# Patient Record
Sex: Female | Born: 1970 | Race: Black or African American | Hispanic: No | Marital: Single | Smoking: Current every day smoker
Health system: Southern US, Community
[De-identification: ages and names within clinical notes are randomized; demographics above are authoritative.]

## PROBLEM LIST (undated history)

## (undated) DIAGNOSIS — Z86018 Personal history of other benign neoplasm: Secondary | ICD-10-CM

## (undated) DIAGNOSIS — M1990 Unspecified osteoarthritis, unspecified site: Secondary | ICD-10-CM

## (undated) DIAGNOSIS — D259 Leiomyoma of uterus, unspecified: Secondary | ICD-10-CM

## (undated) DIAGNOSIS — R05 Cough: Secondary | ICD-10-CM

## (undated) DIAGNOSIS — J302 Other seasonal allergic rhinitis: Secondary | ICD-10-CM

## (undated) DIAGNOSIS — N920 Excessive and frequent menstruation with regular cycle: Secondary | ICD-10-CM

## (undated) DIAGNOSIS — E559 Vitamin D deficiency, unspecified: Secondary | ICD-10-CM

## (undated) DIAGNOSIS — R058 Other specified cough: Secondary | ICD-10-CM

## (undated) DIAGNOSIS — N926 Irregular menstruation, unspecified: Secondary | ICD-10-CM

---

## 1998-07-07 HISTORY — PX: ORIF ORBITAL FRACTURE: SHX5312

## 2004-12-05 ENCOUNTER — Encounter (INDEPENDENT_AMBULATORY_CARE_PROVIDER_SITE_OTHER): Payer: Self-pay | Admitting: *Deleted

## 2004-12-05 ENCOUNTER — Inpatient Hospital Stay (HOSPITAL_COMMUNITY): Admission: RE | Admit: 2004-12-05 | Discharge: 2004-12-07 | Payer: Self-pay | Admitting: Obstetrics and Gynecology

## 2004-12-05 HISTORY — PX: OTHER SURGICAL HISTORY: SHX169

## 2005-06-12 ENCOUNTER — Other Ambulatory Visit: Admission: RE | Admit: 2005-06-12 | Discharge: 2005-06-12 | Payer: Self-pay | Admitting: Obstetrics and Gynecology

## 2008-04-17 ENCOUNTER — Inpatient Hospital Stay (HOSPITAL_COMMUNITY): Admission: AD | Admit: 2008-04-17 | Discharge: 2008-04-20 | Payer: Self-pay | Admitting: Obstetrics and Gynecology

## 2008-05-06 ENCOUNTER — Ambulatory Visit (HOSPITAL_COMMUNITY): Admission: RE | Admit: 2008-05-06 | Discharge: 2008-05-06 | Payer: Self-pay | Admitting: Obstetrics and Gynecology

## 2008-05-06 HISTORY — PX: OTHER SURGICAL HISTORY: SHX169

## 2009-01-07 ENCOUNTER — Emergency Department (HOSPITAL_BASED_OUTPATIENT_CLINIC_OR_DEPARTMENT_OTHER): Admission: EM | Admit: 2009-01-07 | Discharge: 2009-01-07 | Payer: Self-pay | Admitting: Emergency Medicine

## 2010-11-19 NOTE — Discharge Summary (Signed)
Kristi [name], Kristi [name]                ACCOUNT NO.:  [id number]   MEDICAL RECORD NO.:  [id number]          PATIENT TYPE:  INP   LOCATION:  9103                          [facility]ILITY:  [facility]   PHYSICIAN:  Janine Limbo, M.D.DATE OF BIRTH:  06-Jul-1971   DATE OF ADMISSION:  04/17/2008  DATE OF DISCHARGE:                               DISCHARGE SUMMARY   ADMITTING DIAGNOSES:  1. Intrauterine pregnancy at 37 weeks.  2. Nonreassuring fetal heart rate.   DISCHARGE DIAGNOSES:  1. Intrauterine pregnancy at 37 weeks.  2. Nonreassuring fetal heart rate.  3. Open area to the right, the incision with packing inserted.   PROCEDURES:  1. Primary low transverse cesarean section.  2. Wound packing.   HOSPITAL COURSE:  Kristi [name] is a 40 year old gravida 1, para 0 at 75  weeks who presented with spontaneous rupture of membranes on the morning  of April 17, 2008.  Her pregnancy had been remarkable for;  1. History of myomectomy for pedunculated fibroid in 2006, but no      myometrial penetration.  2. History of abnormal Pap.  3. History of STDs.  4. Previous smoker.  5. Group B strep positive.  6. Advanced maternal age.   On admission, leaking of fluid was verified with clear fluid.  Cervix  was a 0.5-1 cm, 80% with the vertex -3 to -4, there was 2+ pitting edema  in lower extremities.  The patient was contracting every 3-7 minutes at  regular quality.  Fetal heart rate was reassuring.  Pitocin was begun.  Epidural was placed later in the afternoon.  At 6:20 p.m., pitocin was  on 14, contractions were every 1-3 minutes.  Montevideos were 180-200.  There were some mild variable decels noted.  Cervix was 400%, vertex -1  with Floxin molding noted.  Amnioinfusion was begun by 10:30.  There was  a deep variable.  Cervix was 90%, vertex -1 with still more molding.  There was a prolonged decel noted during that examination lasting 4  minutes.  Pitocin was discontinued.  IV bolus and O2 were  initiated.  The patient was consented for C-section and the patient was taken to the  operating room where primary low transverse cesarean section was  performed by Dr. Estanislado Pandy under existing epidural anesthesia.  The  findings were a double nuchal cord.  Baby was a female, Kristi [name], born at  10:59 p.m.  Apgars were 9 and 9, weight was 6 pounds 15 ounces.  Infant  was taken to the full-term nursery.  Mother was taken to recovery in  good condition.  By the morning of April 18, 2008, the patient was  doing well.  Her physical exam was within normal limits.  Her hemoglobin  was 10.2, hematocrit 30.5, white blood cell count 12.6, and platelet  count was 247, 2-3+ pitting edema lower extremities.  She did have some  urine output diminishment.  She was given some IV hydration.  Orthostatics were normal.  Over time, urine output began to improve.  By  postop day #2, the patient was doing well.  She was working  on breast-  feeding.  Her physical exam was within normal limits.  Fundus was firm.  Lochia was scant.  Incision was clean, dry, and intact with Steri-Strips  and subcuticular sutures.  She was undecided regarding contraception  choices.  By postop day #3, the patient was doing well, but she wanted a  urinalysis for protein secondary to increased swelling and requested her  mom for UA.  She denied headache, visual symptoms, epigastric pain, and  despite her normal blood pressure, the patient has still requested the  UA.  I reviewed with her the possibility of having a protein in her  urine from lochia and the potential need for a cath UA is necessary.  The patient still wished to proceed with a clean-catch UA.  She was  still undecided regarding contraception.  She has also had some drainage  from her incision.  On examination, the incision was wet with a few  Steri-Strips left.  There was small amount of serosanguineous drainage.  No foul odor.  No obvious incisional separation, but was able  to extrude  a small amount of serosanguineous drainage from the incision with  pressure.  Dr. Stefano Gaul was consulted.  He saw the patient.  He did  explore the incision and found a small open area to the right without  any foul smell or signs of infection.  Incision was clean.  Fascia was  intact and was packed with 4x4 gauze.  He made a decision after  consulting with the patient's mom that she could do the dressing and  packing changes at home and the patient's mother and the patient were in  agreement with this plan.  She was deemed to receive full benefit of her  hospital stay.  Urinalysis was negative for protein and she was  discharged home.   DISCHARGE INSTRUCTIONS:  Per [facility] handout.  The patient  will also have twice daily packing changes done by her mom and then she  will call us with any increase in drainage, any fever, abdominal pain,  foul-smelling odor, or any other significant issues.  Discharge followup  will occur in 1 week at [facility] with Dr. Estanislado Pandy for  incisional check and then 6 weeks for postop C-section exam.  She also  will be seen at any time p.r.n.      Kristi [name], C.N.M.      Janine Limbo, M.D.  Electronically Signed    VLL/MEDQ  D:  04/20/2008  T:  04/21/2008  Job:  [id number]

## 2010-11-19 NOTE — Op Note (Signed)
Kristi [name], Kristi [name]                ACCOUNT NO.:  [id number]   MEDICAL RECORD NO.:  [id number]          PATIENT TYPE:  AMB   LOCATION:  SDC                           [facility]   PHYSICIAN:  Janine Limbo, M.D.DATE OF BIRTH:  07/14/1970   DATE OF PROCEDURE:  05/06/2008  DATE OF DISCHARGE:                               OPERATIVE REPORT   PREOPERATIVE DIAGNOSES:  1. Status post cesarean section on April 17, 2008.  2. Wound separation.  3. Obesity (weight is 204 pounds, height is 5 feet 3 inches).   POSTOPERATIVE DIAGNOSES:  1. Status post cesarean section on April 17, 2008.  2. Wound separation.  3. Obesity (weight is 204 pounds, height is 5 feet 3 inches).   PROCEDURE:  Closure of incision.   SURGEON:  Janine Limbo, MD   FIRST ASSISTANT:  None.   ANESTHETIC:  General.   DISPOSITION:  Ms. Linquist is a 40 year old female, para 1-0-0-1, who had a  cesarean section on April 17, 2008.  She was noted to have a wound  separation and her incision has been cleaned daily.  She has been given  antibiotics.  At this point, she has good granulation tissue and she  wishes to have secondary closure of the incision.  She understands the  indications for her procedure and she accepts the risks of, but not  limited to, anesthetic complications, bleeding, infection, and possible  damage to the surrounding organs.   FINDINGS:  The incision was approximately 6 cm opened at the skin and  there was a small area of subcutaneous tissue that was opened even  larger than the incision.  The incision was approximately 5 cm deep.  There was good granulation tissue noted throughout.  There was no signs  of infection.  There was no evidence of necrotic tissue.   PROCEDURE IN DETAIL:  The patient was taken to the operating room where  general anesthetic was given.  The patient's abdomen was prepped with  multiple layers of Betadine and then sterilely draped.  A 10 mL of 0.5%  Marcaine  with epinephrine were injected directly into the incision.  The  subcutaneous area was closed using interrupted sutures of 2-0 PDS.  The  skin was reapproximated using interrupted sutures of #1 PDS.  Hemostasis  was adequate.  The estimated blood loss was less than 5 mL.  The patient  tolerated her procedure well.  She was awakened from her general  anesthetic without difficulty and then taken to the recovery room in  stable condition.  There were no complications during the procedure.  Sponge, needle, and instrument counts were correct.  Wound  classification is 2.   FOLLOWUP INSTRUCTIONS:  The patient was given a prescription for Motrin  and she will take 800 mg every 8 hours as needed for pain.  She will  also take Percocet 5/325 one tablet or two tablets every 4 hours as  needed for severe pain.  She was given Zofran 4 mg every 4-6 hours as  needed for nausea.  She will return to the office in  1 week for followup  examination.      Janine Limbo, M.D.  Electronically Signed     AVS/MEDQ  D:  05/06/2008  T:  05/06/2008  Job:  [id number]
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
## 2010-11-19 NOTE — Op Note (Signed)
NAMEWENDELIN, READER                ACCOUNT NO.:  [id number]   MEDICAL RECORD NO.:  [id number]          PATIENT TYPE:  INP   LOCATION:  9103                          [facility]   PHYSICIAN:  Crist Fat. Rivard, M.D. DATE OF BIRTH:  01-Dec-1970   DATE OF PROCEDURE:  DATE OF DISCHARGE:                               OPERATIVE REPORT   PREOPERATIVE DIAGNOSIS:  Intrauterine pregnancy at 37 weeks with  nonreassuring fetal heart rate.   POSTOPERATIVE DIAGNOSIS:  Intrauterine pregnancy at 37 weeks with  nonreassuring fetal heart rate.   ANESTHESIA:  Epidural, Dr. Tacy Dura.   PROCEDURE:  Primary low transverse cesarean section.   SURGEON:  Crist Fat. Rivard, MD   ASSISTANT:  Elby Showers. Mayford Knife, CNM   ESTIMATED BLOOD LOSS:  600 mL.   PROCEDURE:  After being informed of the planned procedure with possible  complications including bleeding, infection, injury to bowel, bladder,  or ureters, informed consent is obtained.  The patient was rapidly taken  to OR #1 and placed in the dorsal decubitus position, pelvis tilted to  the left.  A scalp lead was connected to the monitor and shows a fetal  heart rate to be recovered now between 120 and 130.  The patient is then  prepped and draped in a sterile fashion and a Foley catheter is already  in her bladder.  Monitor is removed.  After assessing adequate level of  anesthesia, we infiltrate the previous Pfannenstiel incision from the  previous myomectomy using 20 mL of Marcaine 0.25.  We performed a  Pfannenstiel incision on this previous incision and bring it down  sharply to the fascia.  The fascia is incised in a low transverse  fashion.  Linea alba is dissected and peritoneum is entered bluntly.  An  Alexis retractor is inserted and visceral peritoneum is entered in a low  transverse fashion allowing [place] to safely retract bladder by developing a  bladder flap.  Myometrium is entered in a low transverse fashion first  with knife, then extended  bluntly.  Amniotic fluid is clear.  We  assessed the breath of a female infant in right occiput transverse.  At  10:59 p.m. mouth and nose were suctioned.  Two nuchal cords were  reduced.  Baby was delivered.  Cord was clamped with two Kelly clamps  and sectioned and the baby was given to Dr. Joana Reamer, neonatologist  present in the room.  The placenta was delivered spontaneously.  Cord  has three vessels and uterine revision is negative.   We performed closure of the myometrium in two layers, first with a  running lock suture of 0 Vicryl, then with a Lembert suture of 0 Vicryl  imbricating the first one.  Hemostasis was completed on peritoneal edges  using cauterization.  Both tubes and both ovaries were assessed and  normal.  Both paracolic gutters were cleaned and the pelvis was  profusely irrigated with warm saline to again confirm a satisfactory  hemostasis.   Under fascia hemostasis is completed with cautery and the fascia is  closed with two running sutures of one Vicryl  meeting midline.  Wound is  irrigated with warm saline and undermined to release some tension from  previous scar tissue.  Hemostasis is completed with cautery and the skin  is closed with a subcuticular suture of 3-0 Monocryl and Steri-Strips.   Instrument and sponge count is complete x2.  Estimated blood loss is 600  mL.  The procedure is very well tolerated by the patient who is taken to  recovery room in a well and stable condition.   Little boy named Clayburn Pert was born at 10:59 p.m., received an Apgar of nine  and 1 minute nine at 5 minutes and weighs 6 pounds 15 ounces.   SPECIMEN:  Placenta and cord sent to labor and delivery.      Crist Fat Rivard, M.D.  Electronically Signed     SAR/MEDQ  D:  04/17/2008  T:  04/18/2008  Job:  [id number]

## 2010-11-22 NOTE — H&P (Signed)
[name], [name] NO.:  [id number]   MEDICAL RECORD NO.:  [id number]          PATIENT TYPE:  INP   LOCATION:  NA                            FACILITY:  [facility]   PHYSICIAN:  Janine Limbo, M.D.DATE OF BIRTH:  [birth year]   DATE OF ADMISSION:  DATE OF DISCHARGE:                                HISTORY & PHYSICAL   HISTORY OF PRESENT ILLNESS:  Kristi [name] is a 40 year old female, gravida 0,  who presents with a pelvic mass. The patient has a known history of  fibroids. The patient had a MRI of the pelvis with and without contrast on  October 21, 2004. She was found to have a large retrouterine mass but the  appearance was indeterminate. There was a question of a fibroid.   An ultrasound showed a 7.7 cm x 3.7 cm uterus with an 8.3 x 6.9 cm pelvic  mass and again there was a question of a fibroid versus an ovarian cyst. She  complains of pelvic fullness. The patient does have a past history of  Chlamydia. Her most recent Pap smear was within normal limits.   ALLERGIES:  No known drug allergies.   PAST MEDICAL HISTORY:  1.  The patient has had her wisdom teeth removed.  2.  She also has had eye surgery.  3.  She reports having asthma as a child.  4.  She also reports a past history of migraine headaches.   SOCIAL HISTORY:  The patient smokes one pack of cigarettes every three days.  She drinks alcohol socially. She denies other recreational drug uses.   REVIEW OF SYMPTOMS:  Please see history of present illness.   FAMILY HISTORY:  The patient's father died from a pulmonary embolus. Her  mother has hypercholesterolemia. Her mother also has diabetes. She has a  brother with asthma.   PHYSICAL EXAMINATION:  VITAL SIGNS:  Weight is 173 pounds.  HEENT:  Within normal limits.  CHEST:  Clear.  HEART:  Regular rate and rhythm.  BREASTS:  Without masses.  ABDOMEN:  Nontender. There is a sensation of fullness in the pelvis.  EXTREMITIES:  Grossly normal.  NEUROLOGICAL:  Grossly normal.  PELVIC:  External genitalia is normal. The vagina is normal. Cervix is  nontender and no lesions. Uterus is 10 to 12 weeks size and irregular.  Adnexa has no masses. Rectovaginal exam confirmed.   ASSESSMENT:  1.  Pelvic mass.  2.  Fibroids.   PLAN:  The patient will undergo an exploratory laparotomy. We will remove  fibroids that are encountered. The patient understands the indications for  her surgery and she accepts the risks of but not limited to anesthetic  complications, bleeding, infections, and possible damage to the surrounding  organ.       AVS/MEDQ  D:  12/04/2004  T:  12/04/2004  Job:  [id number]

## 2010-11-22 NOTE — Discharge Summary (Signed)
Kristi [name], Kristi [name]                ACCOUNT NO.:  [id number]   MEDICAL RECORD NO.:  [id number]          PATIENT TYPE:  INP   LOCATION:  9315                          FACILITY:  [facility]   PHYSICIAN:  Janine Limbo, M.D.DATE OF BIRTH:  11-25-1970   DATE OF ADMISSION:  12/05/2004  DATE OF DISCHARGE:  12/07/2004                                 DISCHARGE SUMMARY   DISCHARGE DIAGNOSIS:  Uterine fibroid.   OPERATION:  On the date of admission the patient underwent an exploratory  laparotomy for a pelvic mass along with an abdominal myomectomy and  tolerated the procedures well.  The patient was found to have a pedunculated  fibroid which measured 9.6 x 6.8 x 6.2 cm and weighed 167 grams.   HISTORY OF PRESENT ILLNESS:  [name] is a 40 year old female with a known  history of uterine fibroids who is gravida 0, who presents for exploratory  laparotomy because of a pelvic mass.  Please see the patient's dictated  History and Physical Examination for details.   PREOPERATIVE PHYSICAL EXAMINATION:  Weight 173 pounds.  GENERAL:  Exam is within normal limits, however note on abdominal exam that  there is a sensation of fullness in the pelvic region.  PELVIC:  External genitalia is normal.  Vagina is normal.  Cervix is  nontender without lesions.  Uterus appears 10-12 week size and irregular.  Adnexa without any masses.  Rectovaginal exam confirms.   HOSPITAL COURSE:  On the date of admission the patient underwent  aforementioned procedures, tolerating them all well.  Postoperative course  was unremarkable with the patients postop hemoglobin being 11.6 (preop  hemoglobin 13.4).  By postop day #2 the patient had resumed bowel or bladder  function, and was therefore deemed ready for discharge home.   DISCHARGE MEDICATIONS:  1.  Phenergan 25 mg 1 tablet as needed every 6 hours for nausea.  2.  Ibuprofen 600 mg 1 tablet with food every 6 hours for three days then as      needed for pain.  3.   Ferrous sulfate 325 mg 1 tablet twice daily for six weeks.  4.  Darvocet-N 100 1-2 tablets every 4 hours as needed for pain.   FOLLOWUP:  The patient is scheduled for a six weeks postoperative visit with  Dr. Stefano Gaul on January 22, 2005 at 10:30 a.m.   DISCHARGE INSTRUCTIONS:  The patient was given a copy of [facility] postoperative instruction sheet.  She was further advised to avoid  driving for two weeks, heavy lifting for four weeks, and intercourse for six  weeks.   DISCHARGE DIET:  Without restriction.   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       EJP/MEDQ  D:  12/25/2004  T:  12/25/2004  Job:  [id number]

## 2010-11-22 NOTE — Op Note (Signed)
NAMERESHONDA, KOERBER                ACCOUNT NO.:  [id number]   MEDICAL RECORD NO.:  [id number]          PATIENT TYPE:  INP   LOCATION:  NA                            FACILITY:  [facility]   PHYSICIAN:  Janine Limbo, M.D.DATE OF BIRTH:  [birth year]   DATE OF PROCEDURE:  12/05/2004  DATE OF DISCHARGE:                                 OPERATIVE REPORT   PREOPERATIVE DIAGNOSES:  1.  Pelvic mass.  2.  Fibroid uterus.   POSTOPERATIVE DIAGNOSIS:  Fibroids.   PROCEDURES:  1.  Exploratory laparotomy.  2.  Abdominal myomectomy.   SURGEON:  Janine Limbo, M.D.   FIRST ASSISTANT:  [place]mira J. Adline Peals.   ANESTHETIC:  General.   DISPOSITION:  Ms. Merfeld is a 40 year old female, gravida 0, who presents  with a pelvic mass on ultrasound and on MRI.  She has a known history of  fibroids, but neither study could confirm that the mass was a fibroid  compared to an ovarian lesion.  The patient understands the indications for  her surgical procedure and she accepts the risk of, but not limited to,  anesthetic complications, bleeding, infections, and possible damage to the  surrounding organs.   FINDINGS:  The uterus was normal size.  There was an 8 x 6 cm pedunculated  fibroid from the left fundus.  There was also a 1 cm fibroid on the  posterior mid-upper uterus.  The fallopian tubes and the ovaries were  normal.  Because there was no penetration of the myometrium, I believe that  a vaginal delivery is still appropriate.   PROCEDURE:  The patient was taken to the operating room, where a general  anesthetic was given.  The patient's abdomen, perineum, and vagina were  prepped with multiple layers of Betadine.  A Foley catheter was placed in  the bladder.  The patient was then sterilely draped.  The lower abdomen was  injected with 20 mL of 0.5% Marcaine with epinephrine.  A low transverse  incision was made in the abdomen and carried sharply through the  subcutaneous tissue, fascia, and  the anterior peritoneum.  The pelvis was  explored with findings as mentioned above.  The uterus was elevated into the  operative field.  The base of each fibroid was injected with a total of 10  mL of 0.5% Marcaine with epinephrine.  The fibroid on the posterior upper miduterus was then removed.  Figure-of-  eight sutures of 3-0 Vicryl were used for hemostasis.  Hemostasis was  adequate.  The base of the pedunculated myoma was clamped and cut.  Vicryl 3-  0 was then used for hemostasis.  Interceed was placed over both lesions and  sutured into place.  Hemostasis was noted be adequate throughout.  The  pelvis was then irrigated.  All instruments were removed.  The fascia was closed using a running suture  of 0 Vicryl followed by three interrupted sutures of 0 Vicryl.  The  subcutaneous layer was closed using 3-0 Vicryl.  The skin was reapproximated  using a 4-0 suture of Monocryl.  Sponge, needle, and  instrument counts were  correct.  The estimated blood loss was  5 mL.  The patient tolerated her procedure well.  She was awakened from her  anesthetic without difficulty and taken to the recovery room in stable  condition.  She was given 30 mg of IV Toradol in the operating room.  The  patient was noted to drain clear yellow urine at the end of her procedure.       AVS/MEDQ  D:  12/05/2004  T:  12/05/2004  Job:  [id number]

## 2011-04-07 LAB — URINALYSIS, ROUTINE W REFLEX MICROSCOPIC
Bilirubin Urine: NEGATIVE
Glucose, UA: NEGATIVE
Nitrite: NEGATIVE
Protein, ur: NEGATIVE
Specific Gravity, Urine: 1.02
Urobilinogen, UA: 0.2
Urobilinogen, UA: 0.2

## 2011-04-07 LAB — CBC
HCT: 30.5 — ABNORMAL LOW
HCT: 36.8
HCT: 37.8
Hemoglobin: 10.2 — ABNORMAL LOW
Hemoglobin: 12.2
Hemoglobin: 12.3
MCHC: 33.5
MCV: 89.4
MCV: 89.7
Platelets: 299
Platelets: 621 — ABNORMAL HIGH
RBC: 3.37 — ABNORMAL LOW
RBC: 4.11
RDW: 14.2
RDW: 15.5
WBC: 7.9

## 2011-04-07 LAB — URINE MICROSCOPIC-ADD ON

## 2011-04-07 LAB — ELECTROLYTE PANEL: CO2: 27

## 2015-06-16 ENCOUNTER — Emergency Department (HOSPITAL_BASED_OUTPATIENT_CLINIC_OR_DEPARTMENT_OTHER)
Admission: EM | Admit: 2015-06-16 | Discharge: 2015-06-16 | Disposition: A | Discharge status: Home / Self Care | Payer: 59 | Attending: Emergency Medicine | Admitting: Emergency Medicine

## 2015-06-16 ENCOUNTER — Encounter (HOSPITAL_BASED_OUTPATIENT_CLINIC_OR_DEPARTMENT_OTHER): Payer: Self-pay | Admitting: *Deleted

## 2015-06-16 ENCOUNTER — Emergency Department (HOSPITAL_BASED_OUTPATIENT_CLINIC_OR_DEPARTMENT_OTHER): Payer: 59

## 2015-06-16 DIAGNOSIS — M545 Low back pain: Secondary | ICD-10-CM | POA: Diagnosis present

## 2015-06-16 DIAGNOSIS — F17200 Nicotine dependence, unspecified, uncomplicated: Secondary | ICD-10-CM | POA: Diagnosis not present

## 2015-06-16 DIAGNOSIS — Z3202 Encounter for pregnancy test, result negative: Secondary | ICD-10-CM | POA: Diagnosis not present

## 2015-06-16 DIAGNOSIS — R200 Anesthesia of skin: Secondary | ICD-10-CM | POA: Diagnosis not present

## 2015-06-16 DIAGNOSIS — M546 Pain in thoracic spine: Secondary | ICD-10-CM | POA: Diagnosis not present

## 2015-06-16 LAB — URINALYSIS, ROUTINE W REFLEX MICROSCOPIC
Bilirubin Urine: NEGATIVE
GLUCOSE, UA: NEGATIVE mg/dL
Hgb urine dipstick: NEGATIVE
KETONES UR: NEGATIVE mg/dL
LEUKOCYTES UA: NEGATIVE
NITRITE: NEGATIVE
PH: 6 (ref 5.0–8.0)
PROTEIN: NEGATIVE mg/dL
Specific Gravity, Urine: 1.013 (ref 1.005–1.030)

## 2015-06-16 LAB — PREGNANCY, URINE: Preg Test, Ur: NEGATIVE

## 2015-06-16 NOTE — ED Provider Notes (Signed)
CSN: [id number]     Arrival date & time 06/16/15  1044 History   First MD Initiated Contact with Patient 06/16/15 1141     Chief Complaint  Patient presents with  . Back Pain     (Consider location/radiation/quality/duration/timing/severity/associated sxs/prior Treatment) HPI  44 year old female presents with intermittent left-sided back pain that has been ongoing for the past 2 weeks. Mostly occurs whenever she lays down on her left side. Became concerned because over the last couple days she has had intermittent numbness like her arm is asleep over her left shoulder/trapezius. Has also occasionally been having numbness to her index finger, middle finger, and ring fingertips that comes and goes. Lasts about 5 minutes at a time. Denies weakness in her upper extremity or lower extremity. No neck pain. The pain is intermittent left back. No trauma. Currently while she is sitting up talking to me she denies any pain at all and has no weakness or numbness. No fevers. No known history of cancer. No bowel/bladder incontinence or saddle anesthesia. Symptoms do not change with neck range of motion.  History reviewed. No pertinent past medical history. Past Surgical History  Procedure Laterality Date  . Cesarean section     No family history on file. Social History  Substance Use Topics  . Smoking status: Current Every Day Smoker  . Smokeless tobacco: None  . Alcohol Use: Yes   OB History    No data available     Review of Systems  Constitutional: Negative for fever.  Gastrointestinal: Negative for vomiting and abdominal pain.  Genitourinary: Negative for dysuria and hematuria.  Musculoskeletal: Positive for back pain. Negative for neck pain.  Neurological: Positive for numbness. Negative for weakness and headaches.  All other systems reviewed and are negative.     Allergies  Review of patient's allergies indicates no known allergies.  Home Medications   Prior to Admission  medications   Not on File   BP 126/75 mmHg  Pulse 52  Temp(Src) 97.5 F (36.4 C) (Oral)  Resp 18  Ht 5\' 3"  (1.6 m)  Wt 175 lb (79.379 kg)  BMI 31.01 kg/m2  SpO2 100% Physical Exam  Constitutional: She is oriented to person, place, and time. She appears well-developed and well-nourished.  HENT:  Head: Normocephalic and atraumatic.  Right Ear: External ear normal.  Left Ear: External ear normal.  Nose: Nose normal.  Eyes: Right eye exhibits no discharge. Left eye exhibits no discharge.  Neck: Normal range of motion. Neck supple. No spinous process tenderness and no muscular tenderness present.  Cardiovascular: Normal rate, regular rhythm and normal heart sounds.   Pulmonary/Chest: Effort normal and breath sounds normal.  Abdominal: Soft. She exhibits no distension. There is no tenderness.  Musculoskeletal:       Cervical back: She exhibits no tenderness.       Thoracic back: She exhibits tenderness.       Lumbar back: She exhibits no tenderness.       Back:  Neurological: She is alert and oriented to person, place, and time.  Reflex Scores:      Bicep reflexes are 2+ on the right side and 2+ on the left side.      Patellar reflexes are 2+ on the right side and 2+ on the left side. 5/5 strength in all 4 extremities. Grossly normal sensation  Skin: Skin is warm and dry.  Nursing note and vitals reviewed.   ED Course  Procedures (including critical care time) Labs Review Labs  Reviewed  URINALYSIS, ROUTINE W REFLEX MICROSCOPIC (NOT AT [facility])  PREGNANCY, URINE    Imaging Review Dg Thoracic Spine 2 View  06/16/2015  CLINICAL DATA:  Dorsalgia EXAM: THORACIC SPINE 3 VIEWS COMPARISON:  None. FINDINGS: Frontal, lateral, and swimmer's views were obtained. There is no fracture or spondylolisthesis. There is slight disc narrowing at several levels in the mid thoracic region. No erosive change. No paraspinous lesions are identified. 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 No fracture or spondylolisthesis. Electronically Signed   By: Lowella Grip III M.D.   On: 06/16/2015 12:42   I have personally reviewed and evaluated these images and lab results as part of my medical decision-making.   EKG Interpretation None      MDM   Final diagnoses:  Left-sided thoracic back pain    Patient with what is likely muscular left-sided thoracic pain. Does not appear to be a renal issue and her urinalysis is negative. History is not consistent with pyelonephritis or kidney stone. Seems to be more pain only when touching it such as laying down or palpation. I do not understand why she would also have left arm symptoms. Very nonspecific with only fingertip numbness or trapezius numbness. Her strength and sensation is grossly intact now. At this point I feel she is stable for discharge but she will need to follow-up with a PCP for further outpatient care. Will refer to sports medicine for continuing pain.    Sherwood Gambler, MD 06/16/15 1537

## 2015-06-16 NOTE — ED Notes (Signed)
Patient c/o L side back pain that comes and goes for the past two weeks, pain sometimes shoots down L arm. Hurts to lie down on left side

## 2015-06-17 ENCOUNTER — Emergency Department (HOSPITAL_BASED_OUTPATIENT_CLINIC_OR_DEPARTMENT_OTHER): Payer: 59

## 2015-06-17 ENCOUNTER — Emergency Department (HOSPITAL_BASED_OUTPATIENT_CLINIC_OR_DEPARTMENT_OTHER)
Admission: EM | Admit: 2015-06-17 | Discharge: 2015-06-17 | Disposition: A | Discharge status: Home / Self Care | Payer: 59 | Attending: Emergency Medicine | Admitting: Emergency Medicine

## 2015-06-17 ENCOUNTER — Encounter (HOSPITAL_BASED_OUTPATIENT_CLINIC_OR_DEPARTMENT_OTHER): Payer: Self-pay | Admitting: *Deleted

## 2015-06-17 DIAGNOSIS — F17200 Nicotine dependence, unspecified, uncomplicated: Secondary | ICD-10-CM | POA: Diagnosis not present

## 2015-06-17 DIAGNOSIS — M25512 Pain in left shoulder: Secondary | ICD-10-CM | POA: Insufficient documentation

## 2015-06-17 DIAGNOSIS — M546 Pain in thoracic spine: Secondary | ICD-10-CM | POA: Insufficient documentation

## 2015-06-17 DIAGNOSIS — R200 Anesthesia of skin: Secondary | ICD-10-CM | POA: Diagnosis not present

## 2015-06-17 DIAGNOSIS — M545 Low back pain: Secondary | ICD-10-CM | POA: Diagnosis present

## 2015-06-17 LAB — CBC WITH DIFFERENTIAL/PLATELET
Basophils Absolute: 0 10*3/uL (ref 0.0–0.1)
Basophils Relative: 0 %
EOS PCT: 2 %
Eosinophils Absolute: 0.2 10*3/uL (ref 0.0–0.7)
HCT: 42.6 % (ref 36.0–46.0)
Hemoglobin: 14.1 g/dL (ref 12.0–15.0)
LYMPHS ABS: 4.7 10*3/uL — AB (ref 0.7–4.0)
LYMPHS PCT: 53 %
MCH: 31.1 pg (ref 26.0–34.0)
MCHC: 33.1 g/dL (ref 30.0–36.0)
MCV: 93.8 fL (ref 78.0–100.0)
MONO ABS: 0.7 10*3/uL (ref 0.1–1.0)
Monocytes Relative: 7 %
Neutro Abs: 3.4 10*3/uL (ref 1.7–7.7)
Neutrophils Relative %: 38 %
PLATELETS: 293 10*3/uL (ref 150–400)
RBC: 4.54 MIL/uL (ref 3.87–5.11)
RDW: 13.2 % (ref 11.5–15.5)
WBC: 9 10*3/uL (ref 4.0–10.5)

## 2015-06-17 LAB — BASIC METABOLIC PANEL
ANION GAP: 8 (ref 5–15)
BUN: 12 mg/dL (ref 6–20)
CHLORIDE: 106 mmol/L (ref 101–111)
CO2: 25 mmol/L (ref 22–32)
Calcium: 9.4 mg/dL (ref 8.9–10.3)
Creatinine, Ser: 1.02 mg/dL — ABNORMAL HIGH (ref 0.44–1.00)
Glucose, Bld: 93 mg/dL (ref 65–99)
POTASSIUM: 4.1 mmol/L (ref 3.5–5.1)
Sodium: 139 mmol/L (ref 135–145)

## 2015-06-17 LAB — D-DIMER, QUANTITATIVE (NOT AT ARMC)

## 2015-06-17 LAB — TROPONIN I

## 2015-06-17 MED ORDER — TRAMADOL HCL 50 MG PO TABS
50.0000 mg | ORAL_TABLET | Freq: Four times a day (QID) | ORAL | Status: DC | PRN
Start: 2015-06-17 — End: 2016-11-11

## 2015-06-17 MED ORDER — KETOROLAC TROMETHAMINE 60 MG/2ML IM SOLN: 60.0000 mg | Freq: Once | INTRAMUSCULAR | Status: DC

## 2015-06-17 MED ORDER — CYCLOBENZAPRINE HCL 10 MG PO TABS: 10.0000 mg | ORAL_TABLET | Freq: Two times a day (BID) | ORAL | Status: DC | PRN

## 2015-06-17 NOTE — ED Notes (Signed)
MD at bedside. 

## 2015-06-17 NOTE — ED Provider Notes (Signed)
CSN: [id number]     Arrival date & time 06/17/15  1550 History  By signing my name below, I, Hilda Lias, attest that this documentation has been prepared under the direction and in the presence of Dorie Rank, MD. Electronically Signed: Hilda Lias, ED Scribe. 06/17/2015. 4:22 PM.    Chief Complaint  Patient presents with  . Back Pain      The history is provided by the patient. No language interpreter was used.    HPI Comments: Kristi [name] is a 44 y.o. female who presents to the Emergency Department complaining of intermittent, worsening left-sided mid-back pain underneath the left shoulder blade with associated numbness and tingling in left hand and fingers that has been present for about a week and a half. Pt reports a week and a half ago that she woke up with a sharp pain in the same area that worsened with movement, and reports it was constant for a few days, and then went away for a few days. Pt reports a similar pain came back about 5 days ago for a few days, and then went away until it returned yesterday and has been constant since then. Pt was seen in ED yesterday for the same symptoms, and reports coming back today due to concern of her symptoms being related to a cardiac event. Pt now reports bending over, taking deep breaths, and other movements of the trunk make her pain worse. Pt denies doing anything to treat her pain prior to coming to ED today.   History reviewed. No pertinent past medical history. Past Surgical History  Procedure Laterality Date  . Cesarean section     History reviewed. No pertinent family history. Social History  Substance Use Topics  . Smoking status: Current Every Day Smoker  . Smokeless tobacco: None  . Alcohol Use: Yes   OB History    No data available     Review of Systems  Musculoskeletal: Positive for myalgias and back pain.  Neurological: Positive for numbness.    A complete 10 system review of systems was obtained and all systems  are negative except as noted in the HPI and PMH.    Allergies  Review of patient's allergies indicates no known allergies.  Home Medications   Prior to Admission medications   Medication Sig Start Date End Date Taking? Authorizing Provider  cyclobenzaprine (FLEXERIL) 10 MG tablet Take 1 tablet (10 mg total) by mouth 2 (two) times daily as needed for muscle spasms. 06/17/15   Dorie Rank, MD  traMADol (ULTRAM) 50 MG tablet Take 1 tablet (50 mg total) by mouth every 6 (six) hours as needed. 06/17/15   Dorie Rank, MD   BP 114/63 mmHg  Pulse 52  Temp(Src) 98.2 F (36.8 C) (Oral)  Resp 16  Ht 5\' 3"  (1.6 m)  Wt 79.379 kg  BMI 31.01 kg/m2  SpO2 100% Physical Exam  Constitutional: She appears well-developed and well-nourished. No distress.  HENT:  Head: Normocephalic and atraumatic.  Right Ear: External ear normal.  Left Ear: External ear normal.  Eyes: Conjunctivae are normal. Right eye exhibits no discharge. Left eye exhibits no discharge. No scleral icterus.  Neck: Neck supple. No tracheal deviation present.  Cardiovascular: Normal rate, regular rhythm and intact distal pulses.   Pulmonary/Chest: Effort normal and breath sounds normal. No stridor. No respiratory distress. She has no wheezes. She has no rales.  Abdominal: Soft. Bowel sounds are normal. She exhibits no distension. There is no tenderness. There is no rebound  and no guarding.  Musculoskeletal: She exhibits tenderness. She exhibits no edema.  Tenderness to palpation peri-scapular region on the left side  Neurological: She is alert. She has normal strength. No cranial nerve deficit (no facial droop, extraocular movements intact, no slurred speech) or sensory deficit. She exhibits normal muscle tone. She displays no seizure activity. Coordination normal.  Skin: Skin is warm and dry. No rash noted.  Psychiatric: She has a normal mood and affect.  Nursing note and vitals reviewed.   ED Course  Procedures (including critical  care time)  DIAGNOSTIC STUDIES: Oxygen Saturation is 100% on room air, normal by my interpretation.    COORDINATION OF CARE: 4:06 PM Discussed treatment plan with pt at bedside and pt agreed to plan.   Labs Review Labs Reviewed  CBC WITH DIFFERENTIAL/PLATELET - Abnormal; Notable for the following:    Lymphs Abs 4.7 (*)    All other components within normal limits  BASIC METABOLIC PANEL - Abnormal; Notable for the following:    Creatinine, Ser 1.02 (*)    All other components within normal limits  D-DIMER, QUANTITATIVE (NOT AT [facility])  TROPONIN I    Imaging Review Dg Chest 2 View  06/17/2015  CLINICAL DATA:  Left-sided chest pain intermittent over the last 2 weeks EXAM: CHEST - 2 VIEW COMPARISON:  None. FINDINGS: The heart size and mediastinal contours are within normal limits. Both lungs are clear. The visualized skeletal structures are unremarkable. IMPRESSION: No active disease. Electronically Signed   By: Inez Catalina M.D.   On: 06/17/2015 17:15   Dg Thoracic Spine 2 View  06/16/2015  CLINICAL DATA:  Dorsalgia EXAM: THORACIC SPINE 3 VIEWS COMPARISON:  None. FINDINGS: Frontal, lateral, and swimmer's views were obtained. There is no fracture or spondylolisthesis. There is slight disc narrowing at several levels in the mid thoracic region. No erosive change. No paraspinous lesions are identified. IMPRESSION: Minimal osteoarthritic change at several levels. No fracture or spondylolisthesis. Electronically Signed   By: Lowella Grip III M.D.   On: 06/16/2015 12:42   I have personally reviewed and evaluated these images and lab results as part of my medical decision-making.   EKG Interpretation   Date/Time:  Sunday June 17 2015 16:31:05 EST Ventricular Rate:  60 PR Interval:  134 QRS Duration: 87 QT Interval:  417 QTC Calculation: 417 R Axis:   62 Text Interpretation:  Sinus rhythm No old tracing to compare Confirmed by  Sanaai Doane  MD-J, Huxley Shurley [id number]) on 06/17/2015 4:48:05  PM      MDM   Final diagnoses:  Left-sided thoracic back pain    I suspect her symptoms are musculoskeletal in nature. The component of nerve impingement considering the paresthesias she is noted in her hand the other day.  She does have tenderness to palpation in the periscapular region.  The x-ray does not show pneumonia. Her laboratory tests are reassuring and I doubt pulmonary embolism or acute coronary syndrome.  Dc home with symptomatic treatment.  Follow up with sports medicine as previously recommended I personally performed the services described in this documentation, which was scribed in my presence.  The recorded information has been reviewed and is accurate.   Dorie Rank, MD 06/17/15 [phone]

## 2015-06-17 NOTE — ED Notes (Signed)
Continues to have back pain.  Treated in ED yesterday for same.

## 2015-06-17 NOTE — Discharge Instructions (Signed)

## 2015-06-17 NOTE — ED Notes (Signed)
DC instructions reviewed with both pt and family member, discussed importance of safety while taking PO pain med and muscle relaxants. Also discussed pain and control and the times to return to the ED. Opportunity for questions provided. Teach Back Method utilized

## 2015-07-06 ENCOUNTER — Encounter: Payer: Self-pay | Admitting: Family Medicine

## 2015-07-06 ENCOUNTER — Ambulatory Visit (INDEPENDENT_AMBULATORY_CARE_PROVIDER_SITE_OTHER): Payer: 59 | Admitting: Family Medicine

## 2015-07-06 VITALS — BP 108/71 | HR 53 | Ht 63.0 in | Wt 182.0 lb

## 2015-07-06 DIAGNOSIS — M546 Pain in thoracic spine: Secondary | ICD-10-CM | POA: Diagnosis not present

## 2015-07-06 DIAGNOSIS — M549 Dorsalgia, unspecified: Secondary | ICD-10-CM

## 2015-07-06 NOTE — Patient Instructions (Signed)
Your exam is reassuring that this is muscular. Take aleve 2 tabs twice a day OR ibuprofen 600mg  three times a day with food ONLY if needed for pain and inflammation. Do home exercises shown in the handout most days of the week for next 6 weeks. Discuss possible cardiac stress test with your family physician. Call me if you need anything or want to do physical therapy. Otherwise follow up with me as needed.

## 2015-07-10 DIAGNOSIS — M549 Dorsalgia, unspecified: Secondary | ICD-10-CM | POA: Insufficient documentation

## 2015-07-10 NOTE — Progress Notes (Signed)
PCP: No primary care provider on file.  Subjective:   HPI: Patient is a 45 y.o. female here for back pain.  Patient reports she's had mid back pain since last month. No acute injury or trauma. Pain left side of back and rib cage. Associated tingling into left arm and 3rd, 4th digits. Had radiographs thoracic spine and chest, EKG that were reassuring. Pain level 0/10 now - mild ache. Tried muscle relaxant. No bowel/bladder dysfunction.  No past medical history on file.  Current Outpatient Prescriptions on File Prior to Visit  Medication Sig Dispense Refill  . cyclobenzaprine (FLEXERIL) 10 MG tablet Take 1 tablet (10 mg total) by mouth 2 (two) times daily as needed for muscle spasms. 20 tablet 0  . traMADol (ULTRAM) 50 MG tablet Take 1 tablet (50 mg total) by mouth every 6 (six) hours as needed. 15 tablet 0   No current facility-administered medications on file prior to visit.    Past Surgical History  Procedure Laterality Date  . Cesarean section      Allergies  Allergen Reactions  . Latex Hives    Social History   Social History  . Marital Status: Single    Spouse Name: N/A  . Number of Children: N/A  . Years of Education: N/A   Occupational History  . Not on file.   Social History Main Topics  . Smoking status: Current Every Day Smoker -- 0.50 packs/day    Types: Cigarettes  . Smokeless tobacco: Not on file  . Alcohol Use: 0.0 oz/week    0 Standard drinks or equivalent per week  . Drug Use: No  . Sexual Activity: Not on file   Other Topics Concern  . Not on file   Social History Narrative    No family history on file.  BP 108/71 mmHg  Pulse 53  Ht 5\' 3"  (1.6 m)  Wt 182 lb (82.555 kg)  BMI 32.25 kg/m2  Review of Systems: See HPI above.    Objective:  Physical Exam:  Gen: NAD  Neck/Upper back: No gross deformity, swelling, bruising. TTP left thoracic paraspinal region, lateral ribcage.  No midline/bony TTP. FROM neck without pain . BUE  strength 5/5.   Sensation intact to light touch.   2+ equal reflexes in triceps, biceps, brachioradialis tendons. Negative spurlings. NV intact distal BUEs.    Assessment & Plan:  1. Upper back pain - consistent with muscle strain.  Reassured.  She plans to discuss cardiac stress test with PCP as well given unusual nature of pain and radiation into left arm - I think this is reasonable.  Only risk factor is cigarette smoking.  Shown home exercises to do daily.  Call us if she would like to do physical therapy.  F/u prn otherwise.

## 2015-07-10 NOTE — Assessment & Plan Note (Signed)
consistent with muscle strain.  Reassured.  She plans to discuss cardiac stress test with PCP as well given unusual nature of pain and radiation into left arm - I think this is reasonable.  Only risk factor is cigarette smoking.  Shown home exercises to do daily.  Call us if she would like to do physical therapy.  F/u prn otherwise.

## 2016-11-06 ENCOUNTER — Other Ambulatory Visit: Payer: Self-pay | Admitting: Obstetrics and Gynecology

## 2016-11-11 ENCOUNTER — Other Ambulatory Visit: Payer: Self-pay | Admitting: Obstetrics and Gynecology

## 2016-11-11 ENCOUNTER — Encounter (HOSPITAL_BASED_OUTPATIENT_CLINIC_OR_DEPARTMENT_OTHER): Payer: Self-pay | Admitting: *Deleted

## 2016-11-11 NOTE — Progress Notes (Signed)
NPO AFTER MN.  ARRIVE AT 0600.  NEEDS URINE PREG.  GETTING CBC DONE Monday 11-17-2016.

## 2016-11-17 DIAGNOSIS — D259 Leiomyoma of uterus, unspecified: Secondary | ICD-10-CM | POA: Diagnosis not present

## 2016-11-17 DIAGNOSIS — N72 Inflammatory disease of cervix uteri: Secondary | ICD-10-CM | POA: Diagnosis not present

## 2016-11-17 DIAGNOSIS — Z975 Presence of (intrauterine) contraceptive device: Secondary | ICD-10-CM | POA: Diagnosis not present

## 2016-11-17 DIAGNOSIS — F17210 Nicotine dependence, cigarettes, uncomplicated: Secondary | ICD-10-CM | POA: Diagnosis not present

## 2016-11-17 DIAGNOSIS — N841 Polyp of cervix uteri: Secondary | ICD-10-CM | POA: Diagnosis not present

## 2016-11-17 DIAGNOSIS — N858 Other specified noninflammatory disorders of uterus: Secondary | ICD-10-CM | POA: Diagnosis not present

## 2016-11-17 DIAGNOSIS — Z6833 Body mass index (BMI) 33.0-33.9, adult: Secondary | ICD-10-CM | POA: Diagnosis not present

## 2016-11-17 DIAGNOSIS — E669 Obesity, unspecified: Secondary | ICD-10-CM | POA: Diagnosis not present

## 2016-11-17 DIAGNOSIS — N920 Excessive and frequent menstruation with regular cycle: Secondary | ICD-10-CM | POA: Diagnosis present

## 2016-11-17 LAB — CBC
HCT: 38.6 % (ref 36.0–46.0)
Hemoglobin: 12.5 g/dL (ref 12.0–15.0)
MCH: 30.5 pg (ref 26.0–34.0)
MCHC: 32.4 g/dL (ref 30.0–36.0)
MCV: 94.1 fL (ref 78.0–100.0)
PLATELETS: 268 10*3/uL (ref 150–400)
RBC: 4.1 MIL/uL (ref 3.87–5.11)
RDW: 14 % (ref 11.5–15.5)
WBC: 6.2 10*3/uL (ref 4.0–10.5)

## 2016-11-17 NOTE — H&P (Signed)
Admission History and Physical Exam for a Gynecology Patient  Kristi [name] is a 46 y.o. female, who presents for a laparoscopy assisted vaginal and salpingectomy. She has been followed at the [facility] and [facility] division of [facility]. He complains of heavy bleeding.  She had a Mirena IUD placed in May 2015.  Her bleeding was controlled initially.  Now,her bleeding has become heavy again. An ultrasound showed 2 fibroids.  She has had a myomectomy in the past as well as a cesarean section. Her endometrial biopsy is normal. Her Pap is normal. GC and CHL are negative.  OB History    No data available      Past Medical History:  Diagnosis Date  . Arthritis    back  . Dry cough   . History of uterine fibroid   . Irregular periods   . Menorrhagia   . Seasonal allergies   . Uterine fibroid   . Vitamin D deficiency     No prescriptions prior to admission.    Past Surgical History:  Procedure Laterality Date  . CESAREAN SECTION  04/17/2008  . CLOSURE INCISION FOR POST OP WOUND SEPERATION  05/06/2008  . EXPLORATORY LAPAROTOMY ABDOMINAL MYOMECOTMY  12/05/2004  . ORIF ORBITAL FRACTURE  2000   retained hardware    Allergies  Allergen Reactions  . Latex Rash and Other (See Comments)    "burning and irriation"    Family History: family history is not on file.  Social History:  reports that she has been smoking Cigarettes.  She has a 10.50 pack-year smoking history. She has never used smokeless tobacco. She reports that she drinks alcohol. She reports that she does not use drugs.  Review of systems: See HPI.  Admission Physical Exam:    Body mass index is 33.3 kg/m.  Height 5\' 3"  (1.6 m), weight 188 lb (85.3 kg), last menstrual period 11/01/2016.  HEENT:                 Within normal limits Chest:                   Clear Heart:                    Regular rate and rhythm Breasts:                No masses, skin changes, bleeding,  or discharge present Abdomen:             Nontender, no masses Extremities:          Grossly normal Neurologic exam: Grossly normal  Pelvic exam:  External genitalia: normal general appearance Vaginal: normal without tenderness, induration or masses Cervix: normal appearance Adnexa: normal bimanual exam Uterus: NSSC  Assessment:  Menorrhagia  Fibroid uterus  Prior myomectomy  Plan:  The patient will undergo a assisted vaginal hysterectomy and bilateral salpingectomy .  Eli Hose 11/17/2016

## 2016-11-18 ENCOUNTER — Ambulatory Visit (HOSPITAL_BASED_OUTPATIENT_CLINIC_OR_DEPARTMENT_OTHER): Payer: 59 | Admitting: Anesthesiology

## 2016-11-18 ENCOUNTER — Encounter (HOSPITAL_COMMUNITY)
Admission: RE | Disposition: A | Discharge status: Home / Self Care | Payer: Self-pay | Source: Ambulatory Visit | Attending: Obstetrics and Gynecology

## 2016-11-18 ENCOUNTER — Encounter (HOSPITAL_BASED_OUTPATIENT_CLINIC_OR_DEPARTMENT_OTHER): Payer: Self-pay | Admitting: *Deleted

## 2016-11-18 ENCOUNTER — Ambulatory Visit (HOSPITAL_BASED_OUTPATIENT_CLINIC_OR_DEPARTMENT_OTHER)
Admission: RE | Admit: 2016-11-18 | Discharge: 2016-11-19 | Disposition: A | Discharge status: Home / Self Care | Payer: 59 | Source: Ambulatory Visit | Attending: Obstetrics and Gynecology | Admitting: Obstetrics and Gynecology

## 2016-11-18 DIAGNOSIS — D259 Leiomyoma of uterus, unspecified: Secondary | ICD-10-CM | POA: Insufficient documentation

## 2016-11-18 DIAGNOSIS — Z6833 Body mass index (BMI) 33.0-33.9, adult: Secondary | ICD-10-CM | POA: Insufficient documentation

## 2016-11-18 DIAGNOSIS — N858 Other specified noninflammatory disorders of uterus: Secondary | ICD-10-CM | POA: Insufficient documentation

## 2016-11-18 DIAGNOSIS — Z975 Presence of (intrauterine) contraceptive device: Secondary | ICD-10-CM | POA: Insufficient documentation

## 2016-11-18 DIAGNOSIS — N72 Inflammatory disease of cervix uteri: Secondary | ICD-10-CM | POA: Insufficient documentation

## 2016-11-18 DIAGNOSIS — E669 Obesity, unspecified: Secondary | ICD-10-CM | POA: Insufficient documentation

## 2016-11-18 DIAGNOSIS — F17210 Nicotine dependence, cigarettes, uncomplicated: Secondary | ICD-10-CM | POA: Insufficient documentation

## 2016-11-18 DIAGNOSIS — N841 Polyp of cervix uteri: Secondary | ICD-10-CM | POA: Insufficient documentation

## 2016-11-18 DIAGNOSIS — N920 Excessive and frequent menstruation with regular cycle: Secondary | ICD-10-CM | POA: Diagnosis not present

## 2016-11-18 HISTORY — DX: Cough: R05

## 2016-11-18 HISTORY — DX: Other specified cough: R05.8

## 2016-11-18 HISTORY — DX: Leiomyoma of uterus, unspecified: D25.9

## 2016-11-18 HISTORY — DX: Personal history of other benign neoplasm: Z86.018

## 2016-11-18 HISTORY — PX: LAPAROSCOPIC ASSISTED VAGINAL HYSTERECTOMY: SHX5398

## 2016-11-18 HISTORY — DX: Other seasonal allergic rhinitis: J30.2

## 2016-11-18 HISTORY — DX: Vitamin D deficiency, unspecified: E55.9

## 2016-11-18 HISTORY — DX: Irregular menstruation, unspecified: N92.6

## 2016-11-18 HISTORY — DX: Unspecified osteoarthritis, unspecified site: M19.90

## 2016-11-18 HISTORY — PX: BILATERAL SALPINGECTOMY: SHX5743

## 2016-11-18 HISTORY — DX: Excessive and frequent menstruation with regular cycle: N92.0

## 2016-11-18 LAB — CREATININE, SERUM: CREATININE: 0.99 mg/dL (ref 0.44–1.00)

## 2016-11-18 LAB — POCT PREGNANCY, URINE: Preg Test, Ur: NEGATIVE

## 2016-11-18 SURGERY — HYSTERECTOMY, VAGINAL, LAPAROSCOPY-ASSISTED
Anesthesia: General | Site: Abdomen

## 2016-11-18 MED ORDER — HEPARIN SOD (PORK) LOCK FLUSH 100 UNIT/ML IV SOLN
INTRAVENOUS | Status: AC
  Filled 2016-11-18: qty 5

## 2016-11-18 MED ORDER — IBUPROFEN 800 MG PO TABS
800.0000 mg | ORAL_TABLET | Freq: Three times a day (TID) | ORAL | Status: DC | PRN
  Administered 2016-11-18: 800 mg via ORAL
  Filled 2016-11-18: qty 1

## 2016-11-18 MED ORDER — ONDANSETRON HCL 4 MG/2ML IJ SOLN
INTRAMUSCULAR | Status: AC
  Filled 2016-11-18: qty 2

## 2016-11-18 MED ORDER — ONDANSETRON HCL 4 MG PO TABS: 4.0000 mg | ORAL_TABLET | Freq: Four times a day (QID) | ORAL | Status: DC | PRN

## 2016-11-18 MED ORDER — KETOROLAC TROMETHAMINE 30 MG/ML IJ SOLN
INTRAMUSCULAR | Status: AC
  Filled 2016-11-18: qty 1

## 2016-11-18 MED ORDER — EPHEDRINE SULFATE 50 MG/ML IJ SOLN
INTRAMUSCULAR | Status: DC | PRN
  Administered 2016-11-18: 10 mg via INTRAVENOUS

## 2016-11-18 MED ORDER — METOCLOPRAMIDE HCL 5 MG/ML IJ SOLN
10.0000 mg | Freq: Once | INTRAMUSCULAR | Status: DC | PRN
  Filled 2016-11-18: qty 2

## 2016-11-18 MED ORDER — MIDAZOLAM HCL 2 MG/2ML IJ SOLN
INTRAMUSCULAR | Status: AC
  Filled 2016-11-18: qty 2

## 2016-11-18 MED ORDER — DEXTROSE IN LACTATED RINGERS 5 % IV SOLN: INTRAVENOUS | Status: DC

## 2016-11-18 MED ORDER — STERILE WATER FOR INJECTION IJ SOLN
INTRAMUSCULAR | Status: DC | PRN
  Administered 2016-11-18: 500 mL

## 2016-11-18 MED ORDER — ENOXAPARIN SODIUM 40 MG/0.4ML ~~LOC~~ SOLN: 40.0000 mg | SUBCUTANEOUS | Status: DC

## 2016-11-18 MED ORDER — MIDAZOLAM HCL 5 MG/5ML IJ SOLN
INTRAMUSCULAR | Status: DC | PRN
  Administered 2016-11-18: 2 mg via INTRAVENOUS

## 2016-11-18 MED ORDER — EPHEDRINE 5 MG/ML INJ
INTRAVENOUS | Status: AC
  Filled 2016-11-18: qty 10

## 2016-11-18 MED ORDER — ROCURONIUM BROMIDE 50 MG/5ML IV SOSY
PREFILLED_SYRINGE | INTRAVENOUS | Status: AC
  Filled 2016-11-18: qty 5

## 2016-11-18 MED ORDER — KETOROLAC TROMETHAMINE 30 MG/ML IJ SOLN
30.0000 mg | Freq: Four times a day (QID) | INTRAMUSCULAR | Status: DC
  Administered 2016-11-19: 30 mg via INTRAMUSCULAR
  Filled 2016-11-18 (×3): qty 1

## 2016-11-18 MED ORDER — SIMETHICONE 80 MG PO CHEW: 80.0000 mg | CHEWABLE_TABLET | Freq: Four times a day (QID) | ORAL | Status: DC | PRN

## 2016-11-18 MED ORDER — FENTANYL CITRATE (PF) 100 MCG/2ML IJ SOLN
INTRAMUSCULAR | Status: DC | PRN
  Administered 2016-11-18: 100 ug via INTRAVENOUS
  Administered 2016-11-18: 50 ug via INTRAVENOUS
  Administered 2016-11-18: 100 ug via INTRAVENOUS

## 2016-11-18 MED ORDER — KETOROLAC TROMETHAMINE 30 MG/ML IJ SOLN: 30.0000 mg | Freq: Four times a day (QID) | INTRAMUSCULAR | Status: DC

## 2016-11-18 MED ORDER — BUPIVACAINE LIPOSOME 1.3 % IJ SUSP
INTRAMUSCULAR | Status: AC
  Filled 2016-11-18: qty 20

## 2016-11-18 MED ORDER — HYDROMORPHONE HCL 2 MG PO TABS: 2.0000 mg | ORAL_TABLET | Freq: Four times a day (QID) | ORAL | Status: DC | PRN

## 2016-11-18 MED ORDER — LACTATED RINGERS IV SOLN
INTRAVENOUS | Status: DC
  Administered 2016-11-18 (×2): via INTRAVENOUS
  Filled 2016-11-18: qty 1000

## 2016-11-18 MED ORDER — PROPOFOL 10 MG/ML IV BOLUS
INTRAVENOUS | Status: DC | PRN
  Administered 2016-11-18: 200 mg via INTRAVENOUS

## 2016-11-18 MED ORDER — SODIUM CHLORIDE 0.9 % IJ SOLN
INTRAMUSCULAR | Status: AC
  Filled 2016-11-18: qty 50

## 2016-11-18 MED ORDER — CEFAZOLIN SODIUM-DEXTROSE 2-4 GM/100ML-% IV SOLN
2.0000 g | INTRAVENOUS | Status: AC
  Administered 2016-11-18: 2 g via INTRAVENOUS
  Filled 2016-11-18: qty 100

## 2016-11-18 MED ORDER — BUPIVACAINE HCL (PF) 0.5 % IJ SOLN
INTRAMUSCULAR | Status: AC
  Filled 2016-11-18: qty 30

## 2016-11-18 MED ORDER — ONDANSETRON HCL 4 MG/2ML IJ SOLN
INTRAMUSCULAR | Status: DC | PRN
  Administered 2016-11-18: 4 mg via INTRAVENOUS

## 2016-11-18 MED ORDER — ONDANSETRON HCL 4 MG/2ML IJ SOLN: 4.0000 mg | Freq: Four times a day (QID) | INTRAMUSCULAR | Status: DC | PRN

## 2016-11-18 MED ORDER — VASOPRESSIN 20 UNIT/ML IV SOLN
INTRAVENOUS | Status: AC
  Filled 2016-11-18: qty 1

## 2016-11-18 MED ORDER — METRONIDAZOLE 500 MG PO TABS
500.0000 mg | ORAL_TABLET | Freq: Two times a day (BID) | ORAL | Status: DC
  Administered 2016-11-19: 500 mg via ORAL
  Filled 2016-11-18 (×2): qty 1

## 2016-11-18 MED ORDER — SCOPOLAMINE 1 MG/3DAYS TD PT72
MEDICATED_PATCH | TRANSDERMAL | Status: AC
  Filled 2016-11-18: qty 1

## 2016-11-18 MED ORDER — HYDROMORPHONE HCL 1 MG/ML IJ SOLN
0.2500 mg | INTRAMUSCULAR | Status: DC | PRN
  Filled 2016-11-18: qty 0.5

## 2016-11-18 MED ORDER — KETOROLAC TROMETHAMINE 30 MG/ML IJ SOLN
30.0000 mg | Freq: Four times a day (QID) | INTRAMUSCULAR | Status: DC
  Administered 2016-11-18 – 2016-11-19 (×2): 30 mg via INTRAVENOUS
  Filled 2016-11-18: qty 1

## 2016-11-18 MED ORDER — ENOXAPARIN SODIUM 40 MG/0.4ML ~~LOC~~ SOLN
40.0000 mg | Freq: Two times a day (BID) | SUBCUTANEOUS | Status: DC
  Administered 2016-11-18: 40 mg via SUBCUTANEOUS
  Filled 2016-11-18: qty 0.4

## 2016-11-18 MED ORDER — FENTANYL CITRATE (PF) 250 MCG/5ML IJ SOLN
INTRAMUSCULAR | Status: AC
  Filled 2016-11-18: qty 5

## 2016-11-18 MED ORDER — IBUPROFEN 800 MG PO TABS: 800.0000 mg | ORAL_TABLET | Freq: Three times a day (TID) | ORAL | Status: DC | PRN

## 2016-11-18 MED ORDER — SUGAMMADEX SODIUM 200 MG/2ML IV SOLN
INTRAVENOUS | Status: DC | PRN
  Administered 2016-11-18: 160 mg via INTRAVENOUS

## 2016-11-18 MED ORDER — DEXTROSE IN LACTATED RINGERS 5 % IV SOLN
INTRAVENOUS | Status: DC
  Administered 2016-11-18: 15:00:00 via INTRAVENOUS
  Administered 2016-11-18 – 2016-11-19 (×2): 1000 mL via INTRAVENOUS

## 2016-11-18 MED ORDER — CEFAZOLIN SODIUM-DEXTROSE 2-4 GM/100ML-% IV SOLN
INTRAVENOUS | Status: AC
  Filled 2016-11-18: qty 100

## 2016-11-18 MED ORDER — LIDOCAINE 2% (20 MG/ML) 5 ML SYRINGE
INTRAMUSCULAR | Status: AC
  Filled 2016-11-18: qty 5

## 2016-11-18 MED ORDER — HYDROMORPHONE HCL 2 MG PO TABS
2.0000 mg | ORAL_TABLET | Freq: Four times a day (QID) | ORAL | Status: DC | PRN
Start: 2016-11-18 — End: 2016-11-19
  Filled 2016-11-18: qty 1

## 2016-11-18 MED ORDER — KETOROLAC TROMETHAMINE 30 MG/ML IJ SOLN
INTRAMUSCULAR | Status: DC | PRN
  Administered 2016-11-18: 30 mg via INTRAVENOUS
  Administered 2016-11-18: 30 mg via INTRAMUSCULAR

## 2016-11-18 MED ORDER — BUPIVACAINE HCL (PF) 0.5 % IJ SOLN
INTRAMUSCULAR | Status: DC | PRN
  Administered 2016-11-18: 22 mL

## 2016-11-18 MED ORDER — PROPOFOL 10 MG/ML IV BOLUS
INTRAVENOUS | Status: AC
  Filled 2016-11-18: qty 40

## 2016-11-18 MED ORDER — ROCURONIUM BROMIDE 50 MG/5ML IV SOSY
PREFILLED_SYRINGE | INTRAVENOUS | Status: DC | PRN
  Administered 2016-11-18: 50 mg via INTRAVENOUS

## 2016-11-18 MED ORDER — DEXAMETHASONE SODIUM PHOSPHATE 10 MG/ML IJ SOLN
INTRAMUSCULAR | Status: AC
  Filled 2016-11-18: qty 1

## 2016-11-18 MED ORDER — BUPIVACAINE-EPINEPHRINE (PF) 0.5% -1:200000 IJ SOLN
INTRAMUSCULAR | Status: AC
  Filled 2016-11-18: qty 30

## 2016-11-18 MED ORDER — SODIUM CHLORIDE 0.9 % IR SOLN
Status: DC | PRN
  Administered 2016-11-18: 500 mL

## 2016-11-18 MED ORDER — BUPIVACAINE-EPINEPHRINE 0.5% -1:200000 IJ SOLN
INTRAMUSCULAR | Status: DC | PRN
  Administered 2016-11-18: 23 mL

## 2016-11-18 MED ORDER — LIDOCAINE 2% (20 MG/ML) 5 ML SYRINGE
INTRAMUSCULAR | Status: DC | PRN
  Administered 2016-11-18: 100 mg via INTRAVENOUS

## 2016-11-18 MED ORDER — MEPERIDINE HCL 25 MG/ML IJ SOLN
6.2500 mg | INTRAMUSCULAR | Status: DC | PRN
  Filled 2016-11-18: qty 1

## 2016-11-18 MED ORDER — SCOPOLAMINE 1 MG/3DAYS TD PT72
1.0000 | MEDICATED_PATCH | TRANSDERMAL | Status: DC
  Administered 2016-11-18: 1.5 mg via TRANSDERMAL
  Filled 2016-11-18: qty 1

## 2016-11-18 MED ORDER — FENTANYL CITRATE (PF) 100 MCG/2ML IJ SOLN
INTRAMUSCULAR | Status: AC
  Filled 2016-11-18: qty 2

## 2016-11-18 MED ORDER — DEXAMETHASONE SODIUM PHOSPHATE 10 MG/ML IJ SOLN
INTRAMUSCULAR | Status: DC | PRN
  Administered 2016-11-18: 10 mg via INTRAVENOUS

## 2016-11-18 SURGICAL SUPPLY — 84 items
BANDAGE ADH SHEER 1  50/CT (GAUZE/BANDAGES/DRESSINGS) IMPLANT
BLADE CLIPPER SURG (BLADE) IMPLANT
CABLE HIGH FREQUENCY MONO STRZ (ELECTRODE) IMPLANT
CANISTER SUCT 3000ML PPV (MISCELLANEOUS) ×3 IMPLANT
CLEANER CAUTERY TIP 5X5 PAD (MISCELLANEOUS) ×2 IMPLANT
CLOTH BEACON ORANGE TIMEOUT ST (SAFETY) ×3 IMPLANT
CONT PATH 16OZ SNAP LID 3702 (MISCELLANEOUS) ×3 IMPLANT
COVER BACK TABLE 60X90IN (DRAPES) ×3 IMPLANT
COVER LIGHT HANDLE  1/PK (MISCELLANEOUS) ×2
COVER LIGHT HANDLE 1/PK (MISCELLANEOUS) ×4 IMPLANT
COVER MAYO STAND STRL (DRAPES) ×9 IMPLANT
DECANTER SPIKE VIAL GLASS SM (MISCELLANEOUS) ×18 IMPLANT
DERMABOND ADVANCED (GAUZE/BANDAGES/DRESSINGS)
DERMABOND ADVANCED .7 DNX12 (GAUZE/BANDAGES/DRESSINGS) IMPLANT
DRSG COVADERM PLUS 2X2 (GAUZE/BANDAGES/DRESSINGS) ×3 IMPLANT
DRSG OPSITE POSTOP 3X4 (GAUZE/BANDAGES/DRESSINGS) ×3 IMPLANT
DRSG TELFA 3X8 NADH (GAUZE/BANDAGES/DRESSINGS) IMPLANT
DURAPREP 26ML APPLICATOR (WOUND CARE) ×3 IMPLANT
ELECT REM PT RETURN 9FT ADLT (ELECTROSURGICAL) ×3
ELECTRODE REM PT RTRN 9FT ADLT (ELECTROSURGICAL) ×2 IMPLANT
FILTER SMOKE EVAC LAPAROSHD (FILTER) IMPLANT
GAUZE SPONGE 4X4 16PLY XRAY LF (GAUZE/BANDAGES/DRESSINGS) ×3 IMPLANT
GAUZE VASELINE 3X9 (GAUZE/BANDAGES/DRESSINGS) IMPLANT
GLOVE BIOGEL PI IND STRL 6.5 (GLOVE) ×2 IMPLANT
GLOVE BIOGEL PI IND STRL 7.0 (GLOVE) ×6 IMPLANT
GLOVE BIOGEL PI IND STRL 7.5 (GLOVE) ×4 IMPLANT
GLOVE BIOGEL PI IND STRL 8.5 (GLOVE) ×2 IMPLANT
GLOVE BIOGEL PI INDICATOR 6.5 (GLOVE) ×1
GLOVE BIOGEL PI INDICATOR 7.0 (GLOVE) ×3
GLOVE BIOGEL PI INDICATOR 7.5 (GLOVE) ×2
GLOVE BIOGEL PI INDICATOR 8.5 (GLOVE) ×1
GLOVE ECLIPSE 8.0 STRL XLNG CF (GLOVE) ×9 IMPLANT
GLOVE SURG SS PI 6.5 STRL IVOR (GLOVE) ×3 IMPLANT
GOWN STRL REUS W/ TWL XL LVL3 (GOWN DISPOSABLE) ×2 IMPLANT
GOWN STRL REUS W/TWL XL LVL3 (GOWN DISPOSABLE) ×1
KIT RM TURNOVER CYSTO AR (KITS) ×3 IMPLANT
LEGGING LITHOTOMY PAIR STRL (DRAPES) ×3 IMPLANT
NEEDLE HYPO 18GX1.5 BLUNT FILL (NEEDLE) ×3 IMPLANT
NEEDLE HYPO 25X5/8 SAFETYGLIDE (NEEDLE) ×3 IMPLANT
NEEDLE INSUFFLATION 14GA 120MM (NEEDLE) IMPLANT
NEEDLE INSUFFLATION 14GA 150MM (NEEDLE) IMPLANT
NEEDLE MAYO CATGUT SZ4 (NEEDLE) IMPLANT
NEEDLE SPNL 22GX3.5 QUINCKE BK (NEEDLE) ×3 IMPLANT
NS IRRIG 500ML POUR BTL (IV SOLUTION) ×3 IMPLANT
PACK BASIN DAY SURGERY FS (CUSTOM PROCEDURE TRAY) ×3 IMPLANT
PACK LAVH (CUSTOM PROCEDURE TRAY) ×3 IMPLANT
PAD CLEANER CAUTERY TIP 5X5 (MISCELLANEOUS) ×1
PAD OB MATERNITY 4.3X12.25 (PERSONAL CARE ITEMS) ×3 IMPLANT
PAD PREP 24X48 CUFFED NSTRL (MISCELLANEOUS) ×3 IMPLANT
PADDING ION DISPOSABLE (MISCELLANEOUS) IMPLANT
POUCH SPECIMEN RETRIEVAL 10MM (ENDOMECHANICALS) IMPLANT
SCISSORS LAP 5X35 DISP (ENDOMECHANICALS) IMPLANT
SCISSORS LAP 5X45 EPIX DISP (ENDOMECHANICALS) IMPLANT
SET IRRIG TUBING LAPAROSCOPIC (IRRIGATION / IRRIGATOR) IMPLANT
SET IRRIG Y TYPE TUR BLADDER L (SET/KITS/TRAYS/PACK) IMPLANT
SOLUTION ELECTROLUBE (MISCELLANEOUS) ×3 IMPLANT
SPONGE GAUZE 2X2 8PLY STRL LF (GAUZE/BANDAGES/DRESSINGS) IMPLANT
SPONGE LAP 4X18 X RAY DECT (DISPOSABLE) ×3 IMPLANT
STRIP CLOSURE SKIN 1/4X3 (GAUZE/BANDAGES/DRESSINGS) IMPLANT
SUT CHROMIC 1 CT1 27 (SUTURE) IMPLANT
SUT MNCRL AB 3-0 PS2 27 (SUTURE) ×3 IMPLANT
SUT VIC AB 0 CT1 18XCR BRD8 (SUTURE) ×6 IMPLANT
SUT VIC AB 0 CT1 27 (SUTURE) ×3
SUT VIC AB 0 CT1 27XBRD ANBCTR (SUTURE) ×6 IMPLANT
SUT VIC AB 0 CT1 8-18 (SUTURE) ×3
SUT VIC AB 2-0 SH 27 (SUTURE) ×1
SUT VIC AB 2-0 SH 27XBRD (SUTURE) ×2 IMPLANT
SUT VICRYL 0 ENDOLOOP (SUTURE) IMPLANT
SUT VICRYL 0 TIES 12 18 (SUTURE) ×3 IMPLANT
SUT VICRYL 0 UR6 27IN ABS (SUTURE) ×6 IMPLANT
SYR 50ML LL SCALE MARK (SYRINGE) ×3 IMPLANT
SYR BULB IRRIGATION 50ML (SYRINGE) ×3 IMPLANT
SYR TB 1ML LL NO SAFETY (SYRINGE) ×3 IMPLANT
TOWEL OR 17X24 6PK STRL BLUE (TOWEL DISPOSABLE) ×9 IMPLANT
TRAY FOLEY CATH SILVER 14FR (SET/KITS/TRAYS/PACK) ×3 IMPLANT
TROCAR BALLN 12MMX100 BLUNT (TROCAR) ×3 IMPLANT
TROCAR BLADELESS OPT 5 100 (ENDOMECHANICALS) IMPLANT
TROCAR XCEL DIL TIP R 11M (ENDOMECHANICALS) IMPLANT
TROCAR XCEL NON-BLD 11X100MML (ENDOMECHANICALS) IMPLANT
TROCAR XCEL NON-BLD 5MMX100MML (ENDOMECHANICALS) ×3 IMPLANT
TUBING INSUF HEATED (TUBING) ×3 IMPLANT
WARMER LAPAROSCOPE (MISCELLANEOUS) ×3 IMPLANT
WATER STERILE IRR 500ML POUR (IV SOLUTION) ×3 IMPLANT
YANKAUER SUCT BULB TIP NO VENT (SUCTIONS) ×3 IMPLANT

## 2016-11-18 NOTE — Transfer of Care (Signed)
Immediate Anesthesia Transfer of Care Note  Patient: Kristi [name]  Procedure(s) Performed: Procedure(s): LAPAROSCOPIC ASSISTED VAGINAL HYSTERECTOMY (N/A) BILATERAL SALPINGECTOMY (Bilateral)  Patient Location: PACU  Anesthesia Type:General  Level of Consciousness: sedated and responds to stimulation  Airway & Oxygen Therapy: Patient Spontanous Breathing and Patient connected to nasal cannula oxygen  Post-op Assessment: Report given to RN  Post vital signs: Reviewed and stable  Last Vitals: 160/68, 67, 14, 100, 98.4  Vitals:   11/18/16 0705  BP: (!) 114/37  Pulse: (!) 57  Resp: 16  Temp: 36.6 C    Last Pain:  Vitals:   11/18/16 0705  TempSrc: Oral      Patients Stated Pain Goal: 7 (53/[id number]/82 [id number])  Complications: No apparent anesthesia complications

## 2016-11-18 NOTE — Progress Notes (Signed)
The patient was interviewed and examined today.  The previously documented history and physical examination was reviewed. There are no changes. The operative procedure was reviewed. The risks and benefits were outlined again. The specific risks include, but are not limited to, anesthetic complications, bleeding, infections, and possible damage to the surrounding organs. The patient's questions were answered.  We are ready to proceed as outlined. The likelihood of the patient achieving the goals of this procedure is very likely.   BP (!) 114/37   Pulse (!) 57   Temp 97.8 F (36.6 C) (Oral)   Resp 16   Ht 5\' 3"  (1.6 m)   Wt 178 lb (80.7 kg)   LMP 11/01/2016 (Approximate)   SpO2 98%   BMI 31.53 kg/m   CBC    Component Value Date/Time   WBC 6.2 11/17/2016 0802   RBC 4.10 11/17/2016 0802   HGB 12.5 11/17/2016 0802   HCT 38.6 11/17/2016 0802   PLT 268 11/17/2016 0802   MCV 94.1 11/17/2016 0802   MCH 30.5 11/17/2016 0802   MCHC 32.4 11/17/2016 0802   RDW 14.0 11/17/2016 0802   LYMPHSABS 4.7 (H) 06/17/2015 1630   MONOABS 0.7 06/17/2015 1630   EOSABS 0.2 06/17/2015 1630   BASOSABS 0.0 06/17/2015 1630     Gildardo Cranker, M.D.

## 2016-11-18 NOTE — Anesthesia Preprocedure Evaluation (Signed)
Anesthesia Evaluation  Patient identified by MRN, date of birth, ID band Patient awake    Reviewed: Allergy & Precautions, NPO status , Patient's Chart, lab work & pertinent test results  Airway Mallampati: II  TM Distance: >3 FB Neck ROM: Full    Dental no notable dental hx. (+) Teeth Intact   Pulmonary Current Smoker,    Pulmonary exam normal breath sounds clear to auscultation       Cardiovascular negative cardio ROS Normal cardiovascular exam Rhythm:Regular Rate:Normal     Neuro/Psych negative neurological ROS  negative psych ROS   GI/Hepatic negative GI ROS, Neg liver ROS,   Endo/Other  Obesity  Renal/GU negative Renal ROS  negative genitourinary   Musculoskeletal  (+) Arthritis , Osteoarthritis,  Back   Abdominal (+) + obese,   Peds  Hematology negative hematology ROS (+)   Anesthesia Other Findings   Reproductive/Obstetrics Irregular menses Menorrhagia                             Anesthesia Physical Anesthesia Plan  ASA: II  Anesthesia Plan: General   Post-op Pain Management:    Induction: Intravenous  Airway Management Planned: Oral ETT  Additional Equipment:   Intra-op Plan:   Post-operative Plan: Extubation in OR  Informed Consent: I have reviewed the patients History and Physical, chart, labs and discussed the procedure including the risks, benefits and alternatives for the proposed anesthesia with the patient or authorized representative who has indicated his/her understanding and acceptance.   Dental advisory given  Plan Discussed with: Anesthesiologist, CRNA and Surgeon  Anesthesia Plan Comments:         Anesthesia Quick Evaluation

## 2016-11-18 NOTE — Op Note (Signed)
OPERATIVE NOTE  Kristi [name]  DOB:    [birth year]  MRN:    [id number]  CSN:    [id number]  Date of Surgery:  11/18/2016  Preoperative Diagnosis:  Menorrhagia  Fibroid uterus  Mirena IUD in place  Prior myomectomy  Postoperative Diagnosis:  Same  Procedure:  Laparoscopy Assisted Vaginal Hysterectomy  Bilateral salpingectomy  Surgeon:  Gildardo Cranker, M.D.  Assistant:  Kendall Flack, M.D.  Anesthetic:  General  Disposition:  The patient presents with the above-mentioned diagnosis. She understands the indications for surgical procedure.  She also understands the alternative treatment options. She accepts the risk of, but not limited to, anesthetic complications, bleeding, infections, and possible damage to the surrounding organs.  Findings:  The patient had a uterus that was upper limits normal size. A single fibroid was present. There were no adhesions present from her prior myomectomy. The fallopian tubes and the ovaries were normal. The pelvis, liver, gallbladder, and bowel appeared normal.  Procedure:  The patient was taken to the operating room where a general anesthetic was given. The patient's  abdomen was prepped with ChloraPrep. The perineum and vagina were prepped with multiple layers of Betadine. A Foley catheter was placed in the bladder. An examination under anesthesia was performed. The IUD was removed. A tenaculum was placed inside the uterus. The patient was sterilely draped. The subumbilical area was injected with half percent Marcaine with epinephrine. An incision was made and carried sharply through the subcutaneous tissue, the fascia, and the anterior peritoneum. The Hassan cannula was sutured into place. A pneumoperitoneum was obtained. The laparoscope was inserted. Operative findings are mentioned above. Pictures were taken of the patient's pelvic structures. We felt that we could appropriately proceed with the vaginal portion of our  procedure at this time. The patient was placed in a more lithotomy position. A weighted speculum was placed in the posterior vagina. The cervix was injected with half percent Marcaine with epinephrine. A circumferential incision was made around the cervix. The vaginal mucosa was advanced anteriorly and posteriorly. The anterior cul-de-sac and in the posterior cul-de-sac were sharply entered. Alternating from right to left the uterosacral ligaments, paracervical tissues, parametrial tissues, and uterine arteries were clamped, cut, sutured, and tied securely. The adnexa were then clamped and cut..  The uterus was removed from the operative field. Hemostasis was confirmed. The left mesosalpinx was isolated, clamped, and cut. 2 free ties were placed. An identical procedure was carried out on the opposite side. The sutures attached to the uterosacral ligaments were brought out through the vaginal angles and then tied securely. A McCall culdoplasty suture was placed in the posterior cul-de-sac incorporating the uterosacral ligaments bilaterally and the posterior peritoneum. A final check was made for hemostasis and again hemostasis was confirmed. The vaginal cuff was closed using figure-of-eight sutures incorporating the anterior vaginal mucosa, the anterior peritoneum, posterior peritoneum, and the posterior vaginal mucosa. The McCall culdoplasty suture was tied securely and the apex of the vagina was noted to elevate into the midpelvis. The operator then changed gown and gloves. The pneumoperitoneum was reestablished. The pelvis was inspected and hemostasis was adequate. A 5 mm trocar was placed in the midline of the lower abdomen. The pelvis was irrigated. Half percent Marcaine with epinephrine was injected into the suture lines. We felt that we are ready to end the procedure. The 5 mm trochars were removed under direct visualization. The pneumoperitoneum was allowed to escape. The Hassan cannula was removed. The  subumbilical incision was  closed using a deep suture of 0 Vicryl followed by skin closures of 3-0 Monocryl. The patient tolerated her procedure well. She was awakened from her anesthetic without difficulty and then transported to the recovery room in stable condition. Sponge, needle, and instrument counts were correct on 2 occasions. The estimated blood loss was 50 cc's. 0 Vicryl is the suture material used throughout the procedure. The uterus and fallopian tubes were sent to pathology.   Gildardo Cranker, M.D.  11/18/2016

## 2016-11-18 NOTE — Anesthesia Procedure Notes (Signed)
Procedure Name: Intubation Date/Time: 11/18/2016 9:05 AM Performed by: Bethena Roys T Pre-anesthesia Checklist: Patient identified, Emergency Drugs available, Suction available and Patient being monitored Patient Re-evaluated:Patient Re-evaluated prior to inductionOxygen Delivery Method: Circle system utilized Preoxygenation: Pre-oxygenation with 100% oxygen Intubation Type: IV induction Ventilation: Mask ventilation without difficulty Laryngoscope Size: Mac and 3 Grade View: Grade II Tube type: Oral Tube size: 7.0 mm Number of attempts: 1 Airway Equipment and Method: Stylet and Oral airway Placement Confirmation: ETT inserted through vocal cords under direct vision,  positive ETCO2 and breath sounds checked- equal and bilateral Secured at: 21 cm Tube secured with: Tape Dental Injury: Teeth and Oropharynx as per pre-operative assessment

## 2016-11-18 NOTE — Anesthesia Postprocedure Evaluation (Signed)
Anesthesia Post Note  Patient: Kristi [name]  Procedure(s) Performed: Procedure(s) (LRB): LAPAROSCOPIC ASSISTED VAGINAL HYSTERECTOMY (N/A) BILATERAL SALPINGECTOMY (Bilateral)  Patient location during evaluation: PACU Anesthesia Type: General Level of consciousness: awake and alert and oriented Pain management: pain level controlled Vital Signs Assessment: post-procedure vital signs reviewed and stable Respiratory status: spontaneous breathing, nonlabored ventilation, respiratory function stable and patient connected to nasal cannula oxygen Cardiovascular status: blood pressure returned to baseline and stable Postop Assessment: no signs of nausea or vomiting Anesthetic complications: no       Last Vitals:  Vitals:   11/18/16 1200 11/18/16 1215  BP: 123/64 130/68  Pulse: (!) 53 (!) 54  Resp: 14 14  Temp:      Last Pain:  Vitals:   11/18/16 1200  TempSrc:   PainSc: Asleep                 Solei Wubben A.

## 2016-11-18 NOTE — Progress Notes (Signed)
Day of Surgery Procedure(s) (LRB): LAPAROSCOPIC ASSISTED VAGINAL HYSTERECTOMY (N/A) BILATERAL SALPINGECTOMY (Bilateral)  Subjective: Patient reports tolerating PO and + BM.    Objective: I have reviewed patient's vital signs and intake and output.  General: alert and no distress Resp: clear to auscultation bilaterally Cardio: regular rate and rhythm, S1, S2 normal, no murmur, click, rub or gallop GI: soft, non-tender; bowel sounds normal; no masses,  no organomegaly Extremities: extremities normal, atraumatic, no cyanosis or edema Vaginal Bleeding: minimal  Assessment: s/p Procedure(s): LAPAROSCOPIC ASSISTED VAGINAL HYSTERECTOMY BILATERAL SALPINGECTOMY: stable and tolerating diet  Plan: Advance diet Encourage ambulation  LOS: 0 days    Kristi [name] V 11/18/2016, 11:51 PM

## 2016-11-19 ENCOUNTER — Encounter (HOSPITAL_BASED_OUTPATIENT_CLINIC_OR_DEPARTMENT_OTHER): Payer: Self-pay | Admitting: Obstetrics and Gynecology

## 2016-11-19 DIAGNOSIS — N920 Excessive and frequent menstruation with regular cycle: Secondary | ICD-10-CM | POA: Diagnosis not present

## 2016-11-19 LAB — CBC
HCT: 35.4 % — ABNORMAL LOW (ref 36.0–46.0)
HEMOGLOBIN: 12 g/dL (ref 12.0–15.0)
MCH: 31.7 pg (ref 26.0–34.0)
MCHC: 33.9 g/dL (ref 30.0–36.0)
MCV: 93.4 fL (ref 78.0–100.0)
PLATELETS: 224 10*3/uL (ref 150–400)
RBC: 3.79 MIL/uL — AB (ref 3.87–5.11)
RDW: 13.9 % (ref 11.5–15.5)
WBC: 15.1 10*3/uL — ABNORMAL HIGH (ref 4.0–10.5)

## 2016-11-19 MED ORDER — IBUPROFEN 800 MG PO TABS: 800.0000 mg | ORAL_TABLET | Freq: Three times a day (TID) | ORAL | 1 refills | Status: AC | PRN

## 2016-11-19 MED ORDER — HYDROMORPHONE HCL 2 MG PO TABS: 2.0000 mg | ORAL_TABLET | Freq: Four times a day (QID) | ORAL | 0 refills | Status: AC | PRN

## 2016-11-19 MED ORDER — METRONIDAZOLE 500 MG PO TABS: 500.0000 mg | ORAL_TABLET | Freq: Two times a day (BID) | ORAL | 0 refills | Status: AC

## 2016-11-19 MED ORDER — DOCUSATE SODIUM 100 MG PO CAPS: 100.0000 mg | ORAL_CAPSULE | Freq: Two times a day (BID) | ORAL | 2 refills | Status: AC

## 2016-11-19 MED ORDER — ENOXAPARIN SODIUM 40 MG/0.4ML ~~LOC~~ SOLN
40.0000 mg | Freq: Two times a day (BID) | SUBCUTANEOUS | Status: AC
  Administered 2016-11-19: 40 mg via SUBCUTANEOUS
  Filled 2016-11-19: qty 0.4

## 2016-11-19 MED ORDER — ONDANSETRON HCL 4 MG PO TABS: 4.0000 mg | ORAL_TABLET | Freq: Three times a day (TID) | ORAL | 0 refills | Status: AC | PRN

## 2016-11-19 NOTE — Discharge Summary (Signed)
Physician Discharge Summary  Patient ID: Kristi [name] MRN: [id number] DOB/AGE: May 27, 1971 46 y.o.  Admit date:         11/18/2016 Discharge date: 11/19/2016  Admission Diagnoses:  Irregular Periods, Uterine Fibroids, IUD in place, prior myomectomy  Discharge Diagnoses:   Irregular Periods, Uterine Fibroids, IUD in place, prior myomectomy  Procedures this Admission:  11/18/2016  Procedure(s) (LRB): LAPAROSCOPIC ASSISTED VAGINAL HYSTERECTOMY (N/A) BILATERAL SALPINGECTOMY (Bilateral)  Discharged Condition: good   Admission Hx and PE: The patient has been followed at the [place] [facility]. She has a history of  Irregular Periods, Uterine Fibroids. Please see her documented history and physical exam. An ultrasound showed a fibroid uterus. Her endometrial biopsy is benign. Her Pap smear was normal. Medical management is no longer effective. The patient wants to proceed with definitive therapy.  Hospital course:  On the day of admission, the patient underwent the following Procedure(s): LAPAROSCOPIC ASSISTED VAGINAL HYSTERECTOMY BILATERAL SALPINGECTOMY. Operative findings included normal tubes and ovaries. A fibroid was noted on the uterus. There were no adhesions from the prior myomectomy. The patient tolerated her procedure well. Her postoperative course was uneventful. She quickly tolerated a regular diet. Her postoperative pain was controlled with oral medication. She remained afebrile. Today she was felt to be ready for discharge.   BP (!) 146/49 (BP Location: Left Arm)   Pulse 90   Temp 98.1 F (36.7 C) (Oral)   Resp 18   Ht 5\' 3"  (1.6 m)   Wt 178 lb (80.7 kg)   LMP 11/01/2016 (Approximate)   SpO2 100%   BMI 31.53 kg/m   HEENT: No distress Abdomen: Nontender, incisions clean Extremities within normal limits Bleeding: Minimal  Labs:  Hemoglobin  Date Value Ref Range Status  11/19/2016 12.0 12.0 - 15.0 g/dL Final    HCT  Date Value Ref Range Status  11/19/2016 35.4 (L) 36.0 - 46.0 % Final    Consults: None  Final pathology report: Pending at the time of discharge  Disposition:  The patient will be discharged to home. She has been given a copy of the discharge instructions as prepared by the [facility] for patients who have undergone the Procedure(s): LAPAROSCOPIC ASSISTED VAGINAL HYSTERECTOMY BILATERAL SALPINGECTOMY.   Discharge Instructions    Discharge instructions    Complete by:  As directed      Allergies as of 11/19/2016      Reactions   Latex Rash, Other (See Comments), Hives   "burning and irriation"      Medication List    TAKE these medications   docusate sodium 100 MG capsule Commonly known as:  COLACE Take 1 capsule (100 mg total) by mouth 2 (two) times daily.   fexofenadine 180 MG tablet Commonly known as:  ALLEGRA Take 180 mg by mouth daily as needed for allergies or rhinitis.   HYDROmorphone 2 MG tablet Commonly known as:  DILAUDID Take 1 tablet (2 mg total) by mouth every 6 (six) hours as needed for severe pain.   ibuprofen 800 MG tablet Commonly known as:  ADVIL,MOTRIN Take 1 tablet (800 mg total) by mouth every 8 (eight) hours as needed.   metroNIDAZOLE 500 MG tablet Commonly known as:  FLAGYL Take 1 tablet (500 mg total) by mouth 2 (two) times daily with a meal.   multivitamin with minerals Tabs tablet Take 1 tablet by mouth daily.   ondansetron 4 MG tablet Commonly known as:  ZOFRAN Take 1 tablet (  4 mg total) by mouth every 8 (eight) hours as needed for nausea or vomiting.   valACYclovir 500 MG tablet Commonly known as:  VALTREX Take 500 mg by mouth daily.   Vitamin D (Ergocalciferol) 50000 units Caps capsule Commonly known as:  DRISDOL Take 50,000 Units by mouth 2 (two) times a week.      Follow-up Information    Ena Dawley, MD Follow up in 4 week(s).   Specialty:  Obstetrics and Gynecology Contact  information: [address] [place] [address] [phone]           Signed: Eli Hose 11/19/2016, 10:40 AM

## 2016-11-19 NOTE — Progress Notes (Signed)
Patient able to void 200cc, ambulating, flatus, tolerating PO intake. Discharge instructions and prescriptions given, all patient questions answered.  D/C home with mother in private vehicle.

## 2016-11-19 NOTE — Discharge Instructions (Signed)
Hysterectomy Information A hysterectomy is a surgery to remove your uterus. After surgery, you will no longer have periods. Also, you will not be able to get pregnant. Reasons for this surgery  You have bleeding that is not normal and keeps coming back.  You have lasting (chronic) lower belly (pelvic) pain.  You have a lasting infection.  The lining of your uterus grows outside your uterus.  The lining of your uterus grows in the muscle of your uterus.  Your uterus falls down into your vagina.  You have a growth in your uterus that causes problems.  You have cells that could turn into cancer (precancerous cells).  You have cancer of the uterus or cervix. Types There are 3 types of hysterectomies. Depending on the type, the surgery will:  Remove the top part of the uterus only.  Remove the uterus and the cervix.  Remove the uterus, cervix, and tissue that holds the uterus in place in the lower belly. Ways a hysterectomy can be performed There are 5 ways this surgery can be performed.  A cut (incision) is made in the belly (abdomen). The uterus is taken out through the cut.  A cut is made in the vagina. The uterus is taken out through the cut.  Three or four cuts are made in the belly. A surgical device with a camera is put through one of the cuts. The uterus is cut into small pieces. The uterus is taken out through the cuts or the vagina.  Three or four cuts are made in the belly. A surgical device with a camera is put through one of the cuts. The uterus is taken out through the vagina.  Three or four cuts are made in the belly. A surgical device that is controlled by a computer makes a visual image. The device helps the surgeon control the surgical tools. The uterus is cut into small pieces. The pieces are taken out through the cuts or through the vagina. What can I expect after the surgery?  You will be given pain medicine.  You will need help at home for 3-5 days after  surgery.  You will need to see your doctor in 2-4 weeks after surgery.  You may get hot flashes, have night sweats, and have trouble sleeping.  You may need to have Pap tests in the future if your surgery was related to cancer. Talk to your doctor. It is still good to have regular exams. This information is not intended to replace advice given to you by your health care provider. Make sure you discuss any questions you have with your health care provider. Document Released: 09/15/2011 Document Revised: 11/29/2015 Document Reviewed: 02/28/2013 Elsevier Interactive Patient Education  2017 [employer].

## 2017-05-13 IMAGING — CR DG THORACIC SPINE 2V
3 series · 3 of 3 positions shown · non-contrast
Comparison: None.

CLINICAL DATA: Dorsalgia

EXAM:
THORACIC SPINE 3 VIEWS

[w t-spine a.p. *]
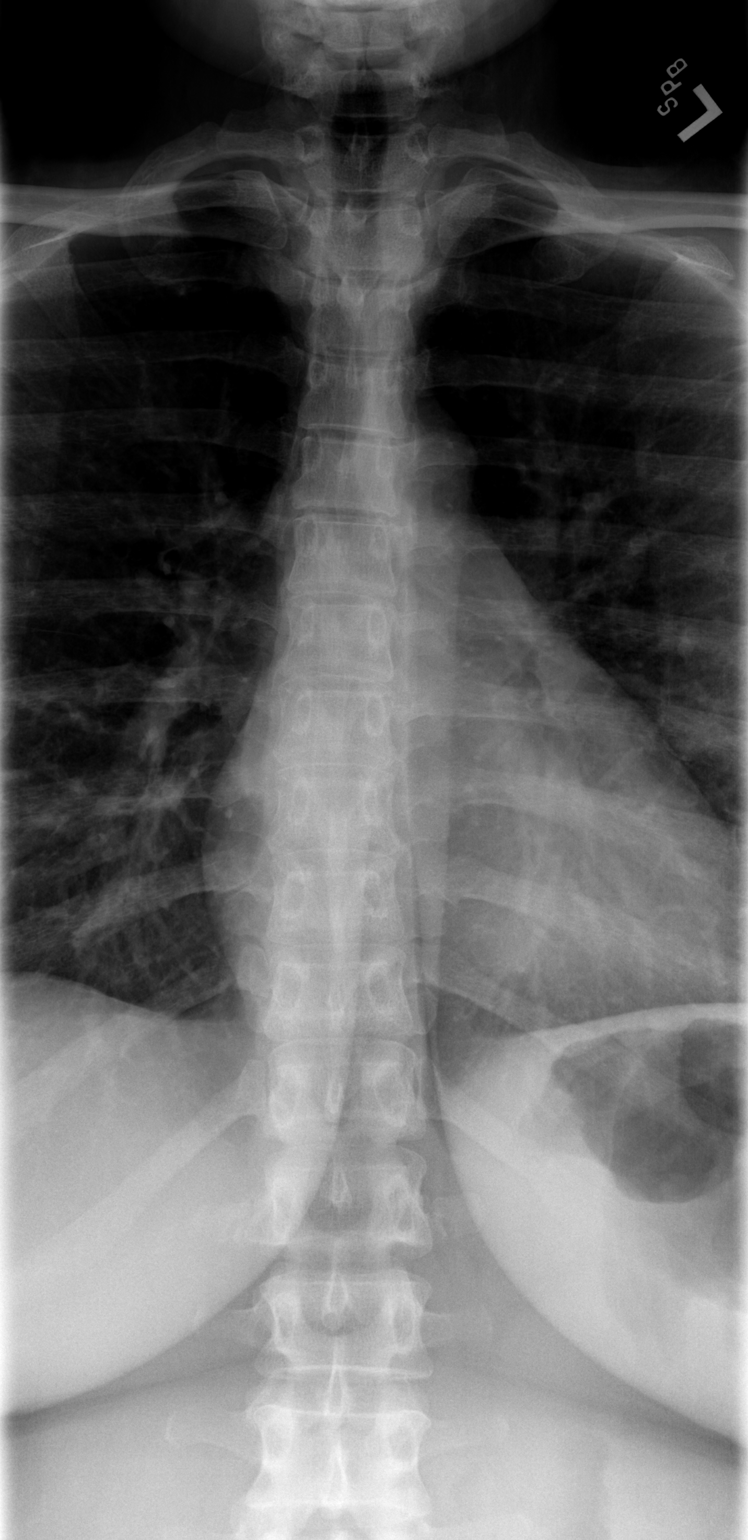

[w t-spine lat *]
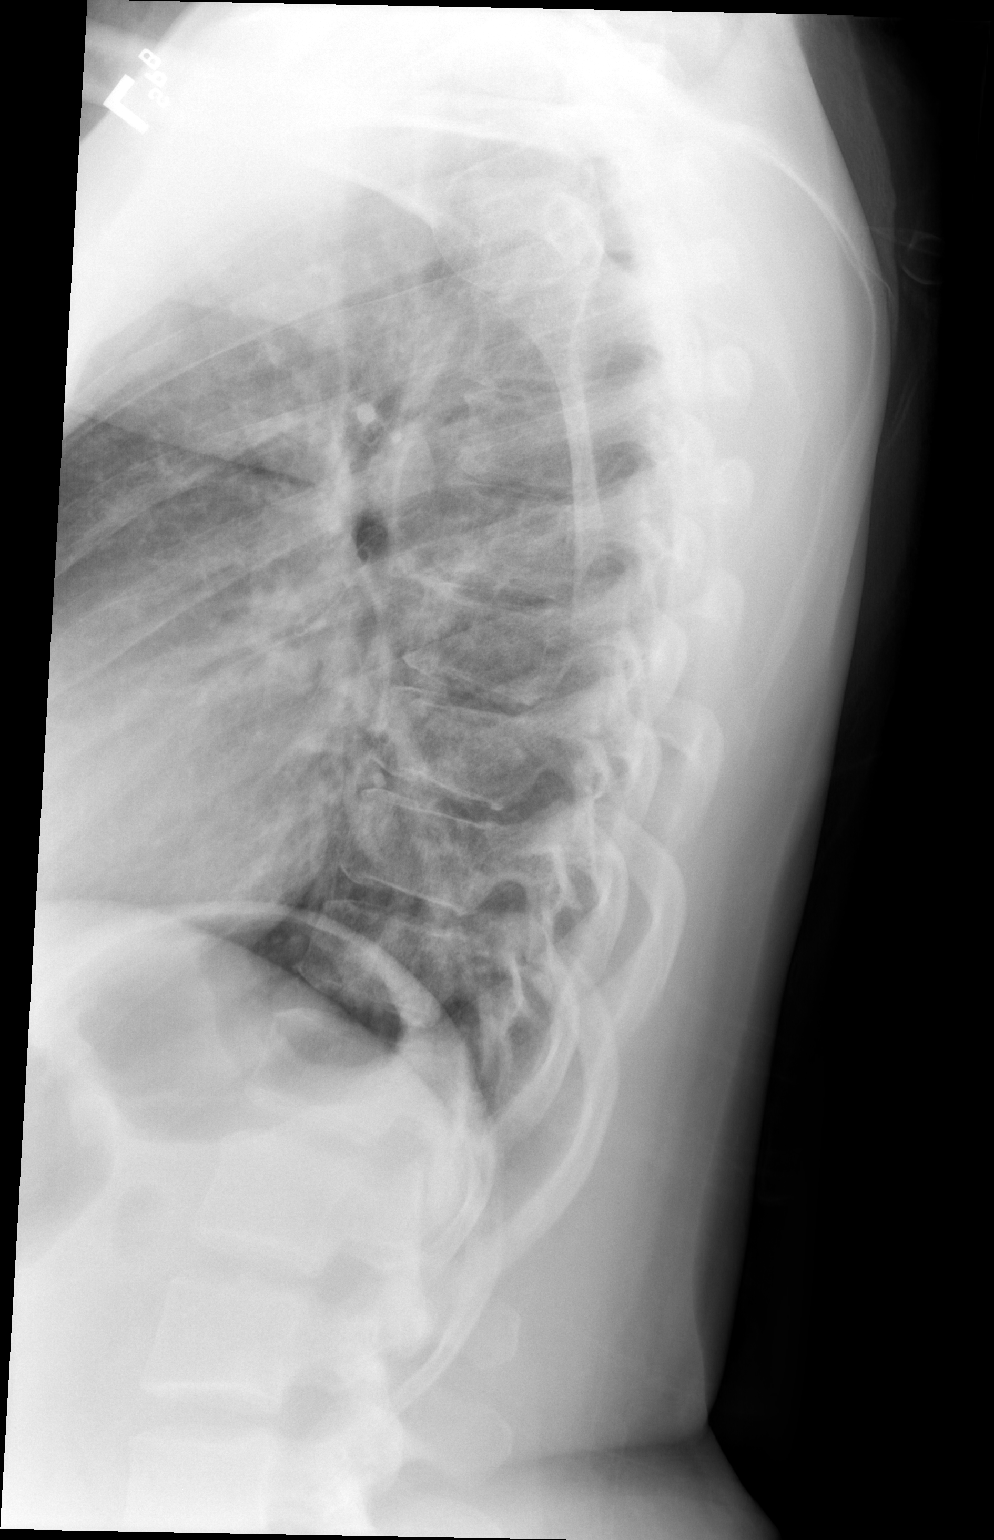

[w swimmers view]
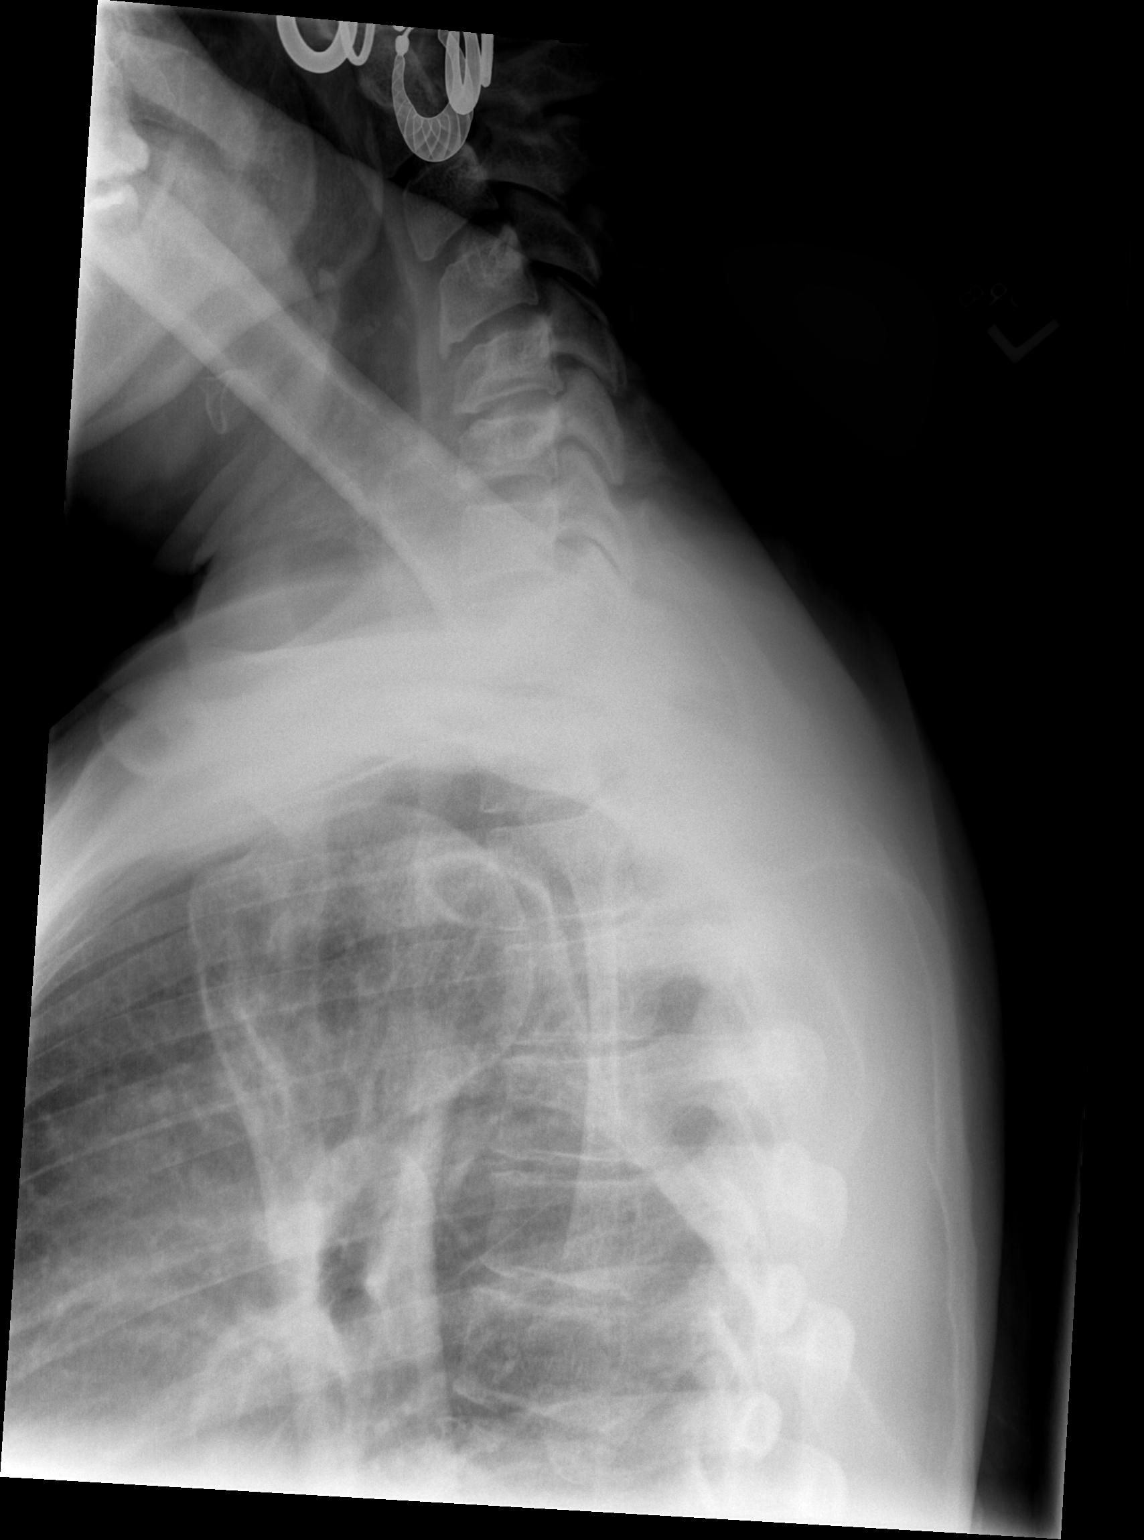

[3 of 3 positions shown; findings below may reference images not displayed]

FINDINGS: Frontal, lateral, and swimmer's views were obtained. There is no
fracture or spondylolisthesis. There is slight disc narrowing at
several levels in the mid thoracic region. No erosive change. No
paraspinous lesions are identified.
IMPRESSION: Minimal osteoarthritic change at several levels. No fracture or
spondylolisthesis.

## 2017-05-14 IMAGING — CR DG CHEST 2V
2 series · 2 of 2 positions shown · non-contrast
Comparison: None.

CLINICAL DATA: Left-sided chest pain intermittent over the last 2
weeks

EXAM:
CHEST - 2 VIEW

[w chest pa]
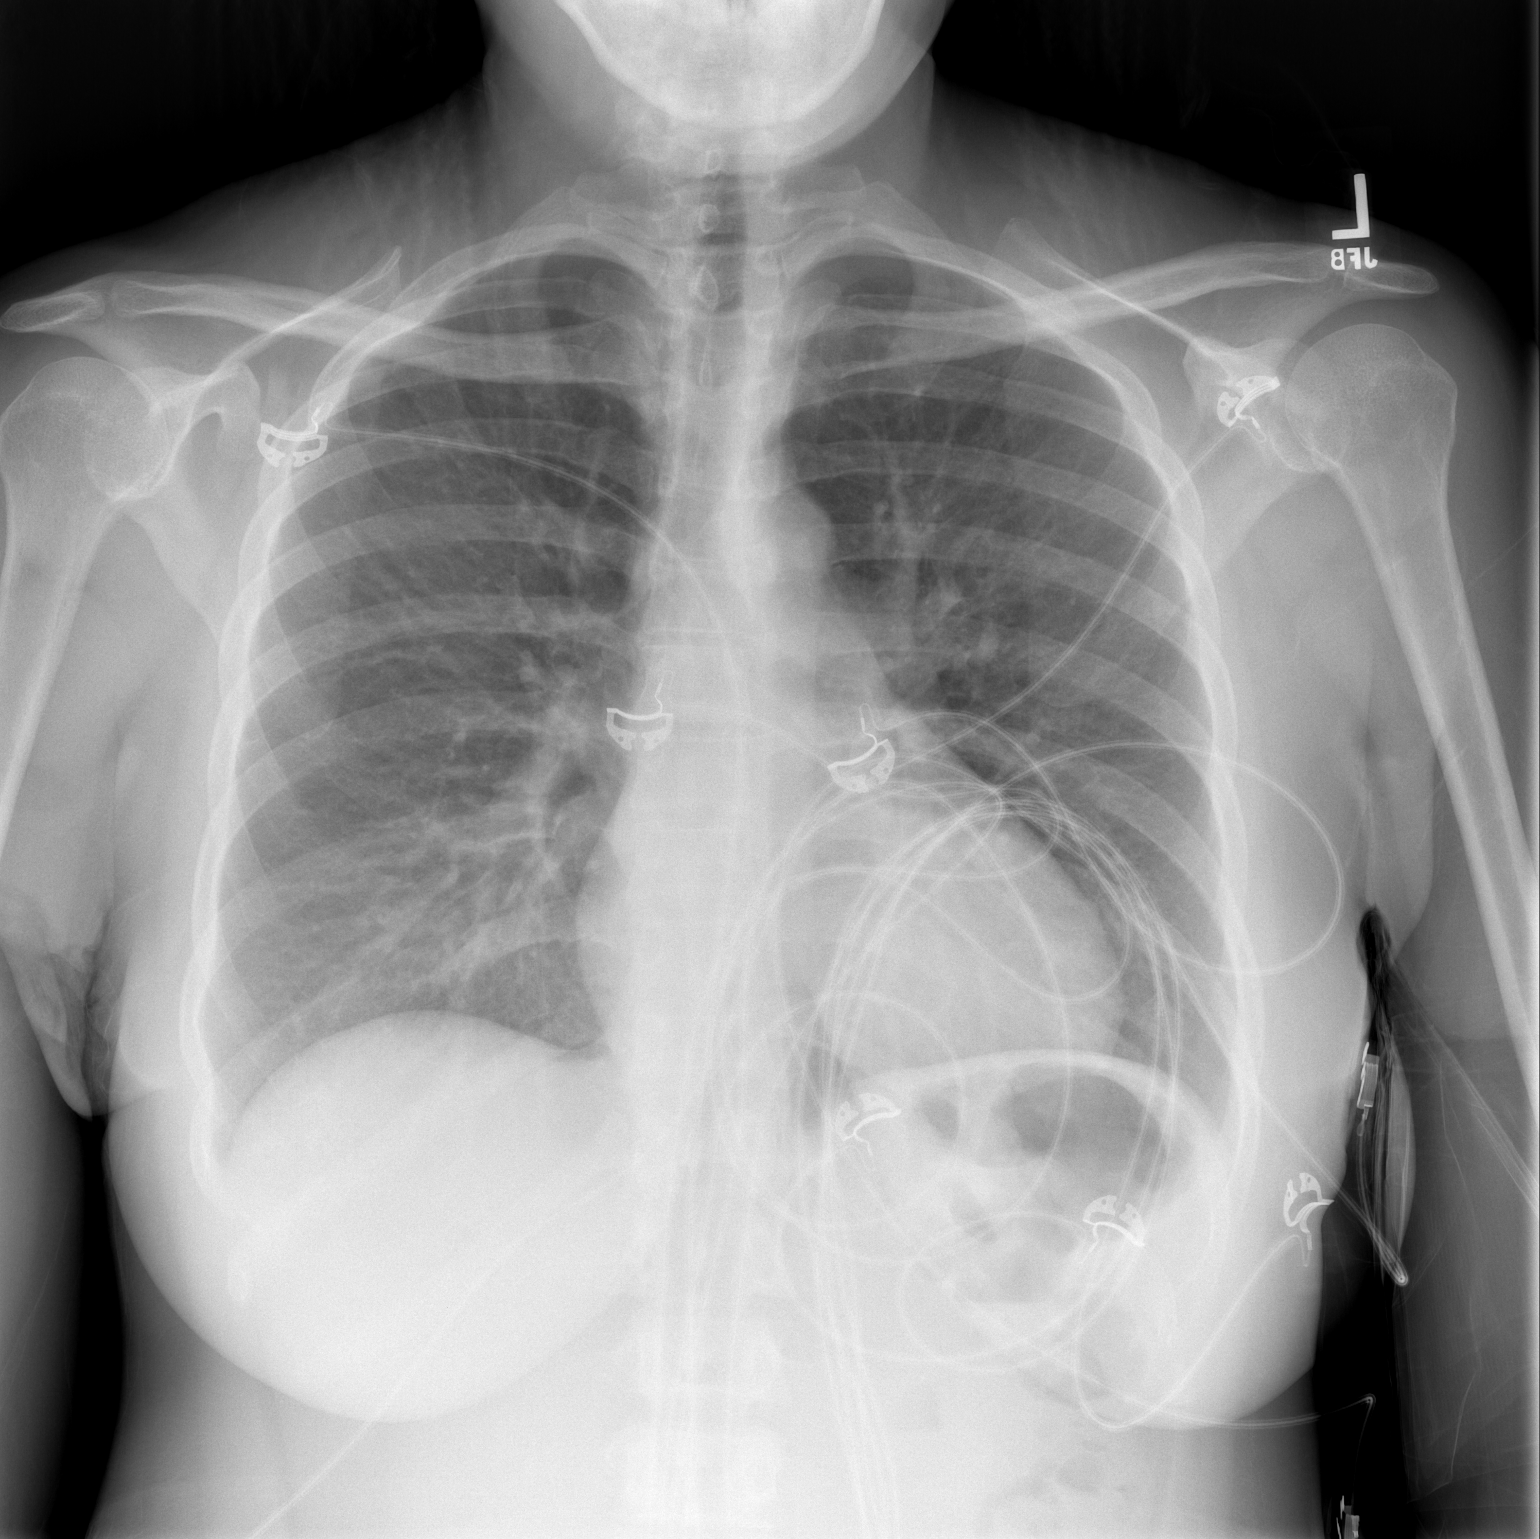

[w chest lat]
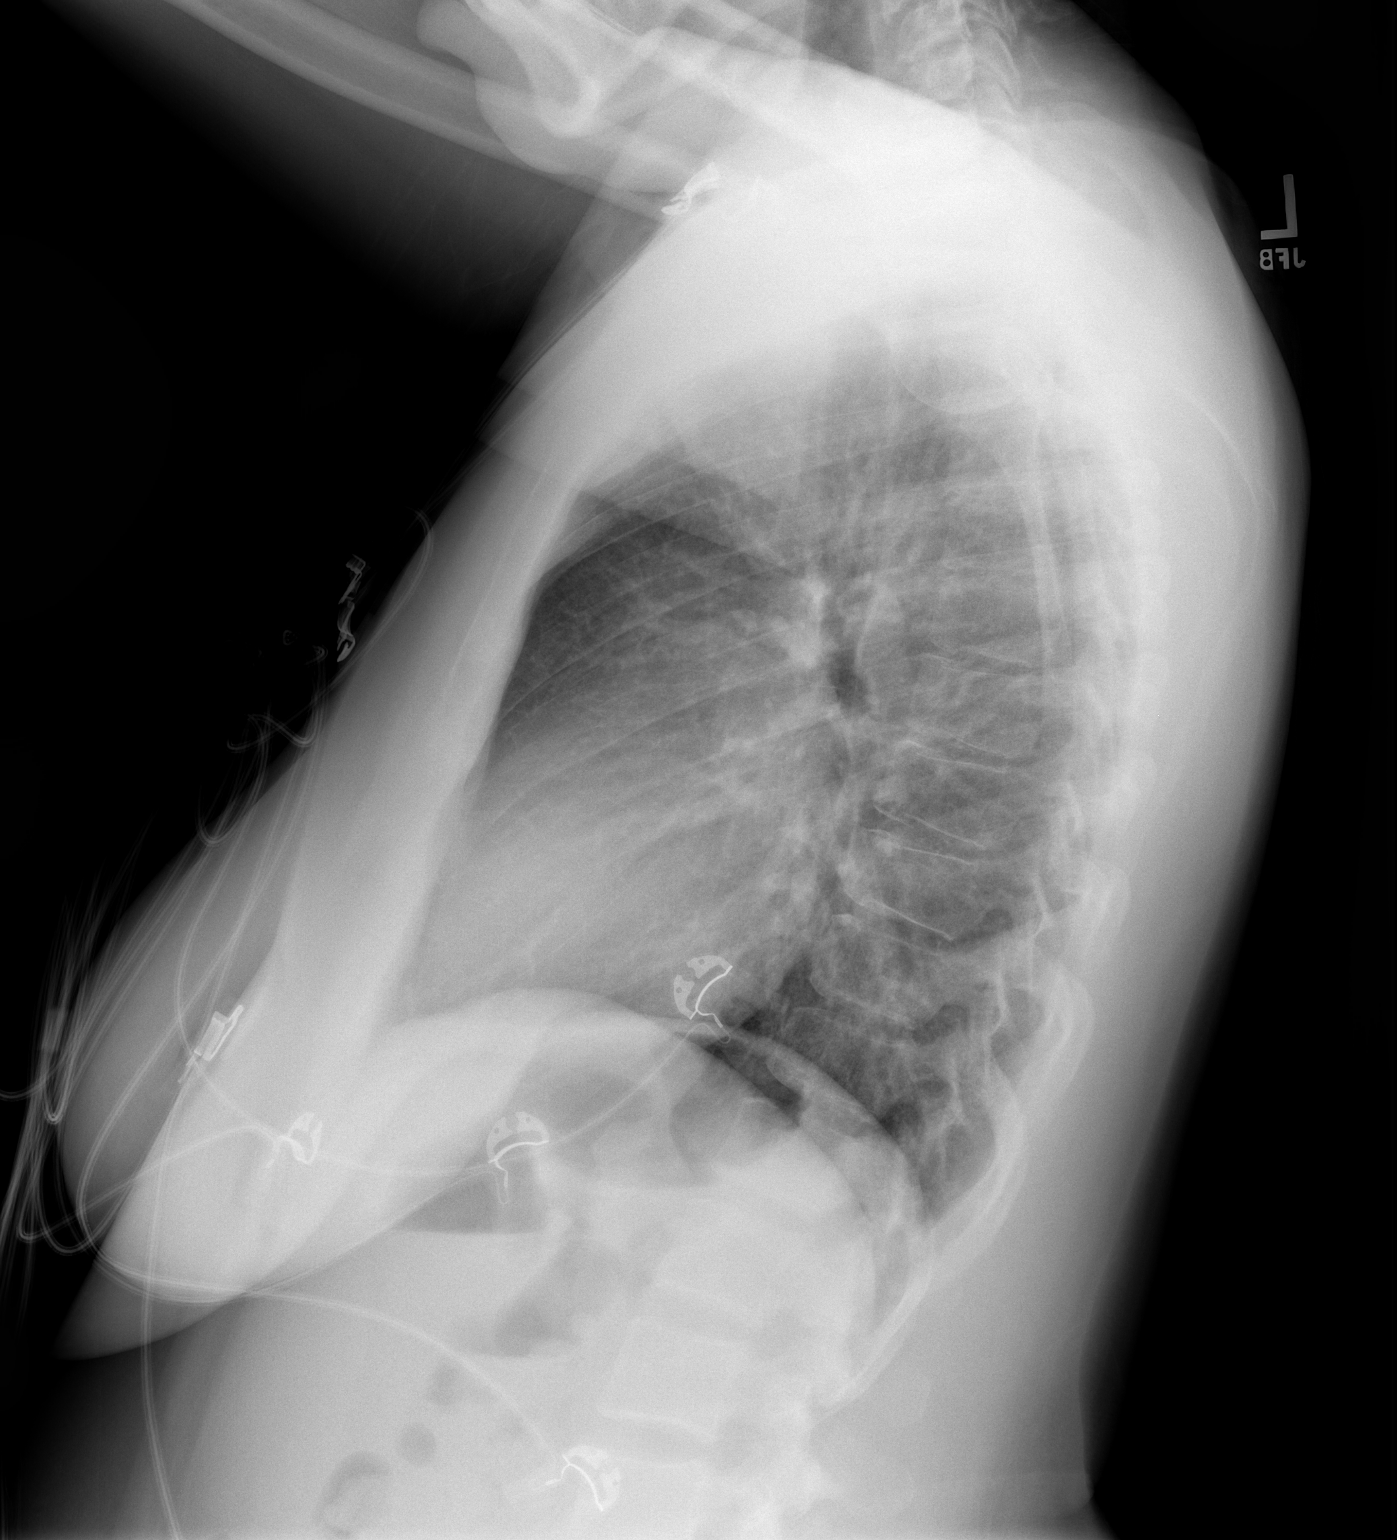

[2 of 2 positions shown; findings below may reference images not displayed]

FINDINGS: The heart size and mediastinal contours are within normal limits.
Both lungs are clear. The visualized skeletal structures are
unremarkable.
IMPRESSION: No active disease.

## 2020-10-29 ENCOUNTER — Other Ambulatory Visit: Payer: Self-pay | Admitting: Obstetrics and Gynecology
